# Patient Record
Sex: Female | Born: 1985 | Race: White | Hispanic: No | Marital: Married | Smoking: Never smoker
Health system: Southern US, Community
[De-identification: ages and names within clinical notes are randomized; demographics above are authoritative.]

## PROBLEM LIST (undated history)

## (undated) ENCOUNTER — Inpatient Hospital Stay (HOSPITAL_COMMUNITY): Payer: Self-pay

## (undated) DIAGNOSIS — Z9889 Other specified postprocedural states: Secondary | ICD-10-CM

## (undated) DIAGNOSIS — N83209 Unspecified ovarian cyst, unspecified side: Secondary | ICD-10-CM

## (undated) DIAGNOSIS — J189 Pneumonia, unspecified organism: Secondary | ICD-10-CM

## (undated) DIAGNOSIS — O034 Incomplete spontaneous abortion without complication: Secondary | ICD-10-CM

## (undated) DIAGNOSIS — I10 Essential (primary) hypertension: Secondary | ICD-10-CM

## (undated) DIAGNOSIS — R87619 Unspecified abnormal cytological findings in specimens from cervix uteri: Secondary | ICD-10-CM

## (undated) DIAGNOSIS — J111 Influenza due to unidentified influenza virus with other respiratory manifestations: Secondary | ICD-10-CM

## (undated) DIAGNOSIS — IMO0002 Reserved for concepts with insufficient information to code with codable children: Secondary | ICD-10-CM

## (undated) DIAGNOSIS — F419 Anxiety disorder, unspecified: Secondary | ICD-10-CM

## (undated) HISTORY — PX: WISDOM TOOTH EXTRACTION: SHX21

## (undated) HISTORY — DX: Unspecified ovarian cyst, unspecified side: N83.209

## (undated) HISTORY — PX: COLPOSCOPY: SHX161

---
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
## 2005-05-15 ENCOUNTER — Ambulatory Visit: Payer: Self-pay | Admitting: Internal Medicine

## 2012-01-26 ENCOUNTER — Other Ambulatory Visit: Payer: Self-pay | Admitting: Otolaryngology

## 2012-01-26 ENCOUNTER — Ambulatory Visit
Admission: RE | Admit: 2012-01-26 | Discharge: 2012-01-26 | Disposition: A | Discharge status: Home / Self Care | Payer: BC Managed Care – PPO | Source: Ambulatory Visit | Attending: Otolaryngology | Admitting: Otolaryngology

## 2012-03-23 LAB — OB RESULTS CONSOLE RPR: RPR: NONREACTIVE

## 2012-03-23 LAB — OB RESULTS CONSOLE ABO/RH: RH Type: POSITIVE

## 2012-08-16 ENCOUNTER — Other Ambulatory Visit (HOSPITAL_COMMUNITY): Payer: Self-pay | Admitting: Obstetrics and Gynecology

## 2012-08-16 ENCOUNTER — Other Ambulatory Visit: Payer: Self-pay

## 2012-08-16 DIAGNOSIS — Z3689 Encounter for other specified antenatal screening: Secondary | ICD-10-CM

## 2012-08-16 DIAGNOSIS — IMO0002 Reserved for concepts with insufficient information to code with codable children: Secondary | ICD-10-CM

## 2012-08-16 DIAGNOSIS — O283 Abnormal ultrasonic finding on antenatal screening of mother: Secondary | ICD-10-CM

## 2012-08-17 ENCOUNTER — Inpatient Hospital Stay (HOSPITAL_COMMUNITY)
Admission: AD | Admit: 2012-08-17 | Discharge: 2012-08-19 | DRG: 886 | Disposition: A | Discharge status: Home / Self Care | Payer: BC Managed Care – PPO | Source: Ambulatory Visit | Attending: Obstetrics and Gynecology | Admitting: Obstetrics and Gynecology

## 2012-08-17 ENCOUNTER — Encounter (HOSPITAL_COMMUNITY): Payer: Self-pay | Admitting: *Deleted

## 2012-08-17 ENCOUNTER — Inpatient Hospital Stay (HOSPITAL_COMMUNITY): Payer: BC Managed Care – PPO

## 2012-08-17 DIAGNOSIS — J111 Influenza due to unidentified influenza virus with other respiratory manifestations: Secondary | ICD-10-CM

## 2012-08-17 DIAGNOSIS — J069 Acute upper respiratory infection, unspecified: Secondary | ICD-10-CM | POA: Diagnosis present

## 2012-08-17 DIAGNOSIS — IMO0002 Reserved for concepts with insufficient information to code with codable children: Secondary | ICD-10-CM

## 2012-08-17 DIAGNOSIS — J1100 Influenza due to unidentified influenza virus with unspecified type of pneumonia: Secondary | ICD-10-CM | POA: Diagnosis present

## 2012-08-17 DIAGNOSIS — J189 Pneumonia, unspecified organism: Secondary | ICD-10-CM

## 2012-08-17 DIAGNOSIS — O358XX0 Maternal care for other (suspected) fetal abnormality and damage, not applicable or unspecified: Secondary | ICD-10-CM | POA: Diagnosis present

## 2012-08-17 DIAGNOSIS — O99891 Other specified diseases and conditions complicating pregnancy: Principal | ICD-10-CM | POA: Diagnosis present

## 2012-08-17 DIAGNOSIS — R05 Cough: Secondary | ICD-10-CM | POA: Diagnosis present

## 2012-08-17 DIAGNOSIS — R030 Elevated blood-pressure reading, without diagnosis of hypertension: Secondary | ICD-10-CM | POA: Diagnosis present

## 2012-08-17 DIAGNOSIS — R059 Cough, unspecified: Secondary | ICD-10-CM | POA: Diagnosis present

## 2012-08-17 HISTORY — DX: Influenza due to unidentified influenza virus with other respiratory manifestations: J11.1

## 2012-08-17 LAB — URINALYSIS, ROUTINE W REFLEX MICROSCOPIC
Bilirubin Urine: NEGATIVE
Glucose, UA: NEGATIVE mg/dL
Hgb urine dipstick: NEGATIVE
Ketones, ur: 15 mg/dL — AB
Leukocytes, UA: NEGATIVE
Nitrite: NEGATIVE
Protein, ur: NEGATIVE mg/dL
Specific Gravity, Urine: >1.03 — ABNORMAL HIGH (ref 1.005–1.030)
Urobilinogen, UA: 0.2 mg/dL (ref 0.0–1.0)
pH: 6 (ref 5.0–8.0)

## 2012-08-17 LAB — CBC WITH DIFFERENTIAL/PLATELET
Basophils Absolute: 0 10*3/uL (ref 0.0–0.1)
Basophils Relative: 0 % (ref 0–1)
Eosinophils Absolute: 0.2 10*3/uL (ref 0.0–0.7)
Eosinophils Relative: 1 % (ref 0–5)
HCT: 32.7 % — ABNORMAL LOW (ref 36.0–46.0)
Hemoglobin: 10.8 g/dL — ABNORMAL LOW (ref 12.0–15.0)
Lymphocytes Relative: 10 % — ABNORMAL LOW (ref 12–46)
Lymphs Abs: 1.7 10*3/uL (ref 0.7–4.0)
MCH: 27.6 pg (ref 26.0–34.0)
MCHC: 33 g/dL (ref 30.0–36.0)
MCV: 83.4 fL (ref 78.0–100.0)
Monocytes Absolute: 0.9 10*3/uL (ref 0.1–1.0)
Monocytes Relative: 5 % (ref 3–12)
Neutro Abs: 13.9 10*3/uL — ABNORMAL HIGH (ref 1.7–7.7)
Neutrophils Relative %: 83 % — ABNORMAL HIGH (ref 43–77)
Platelets: 362 10*3/uL (ref 150–400)
RBC: 3.92 MIL/uL (ref 3.87–5.11)
RDW: 12.9 % (ref 11.5–15.5)
WBC: 16.7 10*3/uL — ABNORMAL HIGH (ref 4.0–10.5)

## 2012-08-17 LAB — COMPREHENSIVE METABOLIC PANEL
ALT: 32 U/L (ref 0–35)
AST: 30 U/L (ref 0–37)
Alkaline Phosphatase: 158 U/L — ABNORMAL HIGH (ref 39–117)
CO2: 22 mEq/L (ref 19–32)
Chloride: 100 mEq/L (ref 96–112)
GFR calc non Af Amer: >90 mL/min (ref >90)
Glucose, Bld: 86 mg/dL (ref 70–99)
Sodium: 135 mEq/L (ref 135–145)
Total Bilirubin: 0.2 mg/dL — ABNORMAL LOW (ref 0.3–1.2)

## 2012-08-17 LAB — TYPE AND SCREEN: ABO/RH(D): O POS

## 2012-08-17 MED ORDER — DEXTROSE 5 % IV SOLN
1.0000 g | Freq: Two times a day (BID) | INTRAVENOUS | Status: DC
  Administered 2012-08-18 – 2012-08-19 (×3): 1 g via INTRAVENOUS
  Filled 2012-08-17 (×3): qty 10

## 2012-08-17 MED ORDER — CALCIUM CARBONATE ANTACID 500 MG PO CHEW: 2.0000 | CHEWABLE_TABLET | ORAL | Status: DC | PRN

## 2012-08-17 MED ORDER — ACETAMINOPHEN 500 MG PO TABS
1000.0000 mg | ORAL_TABLET | Freq: Once | ORAL | Status: AC
  Administered 2012-08-17: 1000 mg via ORAL
  Filled 2012-08-17: qty 2

## 2012-08-17 MED ORDER — SODIUM CHLORIDE 0.9 % IV SOLN
INTRAVENOUS | Status: DC
  Administered 2012-08-17 – 2012-08-18 (×2): via INTRAVENOUS

## 2012-08-17 MED ORDER — LACTATED RINGERS IV SOLN
INTRAVENOUS | Status: DC
  Administered 2012-08-17: 16:00:00 via INTRAVENOUS

## 2012-08-17 MED ORDER — PRENATAL MULTIVITAMIN CH: 1.0000 | ORAL_TABLET | Freq: Every day | ORAL | Status: DC

## 2012-08-17 MED ORDER — CEFTRIAXONE SODIUM 1 G IJ SOLR: 1.0000 g | INTRAMUSCULAR | Status: DC

## 2012-08-17 MED ORDER — DOCUSATE SODIUM 100 MG PO CAPS
100.0000 mg | ORAL_CAPSULE | Freq: Every day | ORAL | Status: DC
  Filled 2012-08-17: qty 1

## 2012-08-17 MED ORDER — DEXTROSE 5 % IV SOLN
1.0000 g | INTRAVENOUS | Status: DC
  Administered 2012-08-17: 1 g via INTRAVENOUS
  Filled 2012-08-17: qty 10

## 2012-08-17 MED ORDER — AZITHROMYCIN 500 MG PO TABS
500.0000 mg | ORAL_TABLET | Freq: Every day | ORAL | Status: DC
  Administered 2012-08-17 – 2012-08-18 (×2): 500 mg via ORAL
  Filled 2012-08-17 (×3): qty 1

## 2012-08-17 MED ORDER — HYDROCOD POLST-CHLORPHEN POLST 10-8 MG/5ML PO LQCR
5.0000 mL | Freq: Two times a day (BID) | ORAL | Status: DC | PRN
  Administered 2012-08-17 – 2012-08-19 (×3): 5 mL via ORAL
  Filled 2012-08-17 (×3): qty 5

## 2012-08-17 MED ORDER — ZOLPIDEM TARTRATE 5 MG PO TABS: 5.0000 mg | ORAL_TABLET | Freq: Every evening | ORAL | Status: DC | PRN

## 2012-08-17 MED ORDER — ACETAMINOPHEN 325 MG PO TABS: 650.0000 mg | ORAL_TABLET | ORAL | Status: DC | PRN

## 2012-08-17 NOTE — MAU Note (Signed)
A wk ago had a fever, saw dr was dx with sinus infection, on Fri still had a fever- was cultured and tested pos for flu.  Too late to start on tamiflu. Yesterday was reg appt, was feeling better in a.m.  In the afternoon, started feeling worse, cough is worse and has become short of breath.

## 2012-08-17 NOTE — H&P (Signed)
Danae Orleans, CNM Certified Nurse Midwife Signed Obstetrics MAU Provider Note 08/17/2012 1:41 PM  Chief Complaint:  Shortness of Breath, Cough and Influenza Initial contact at 1335     HPI: Jasmine Wolfe is a 26 y.o. G1P0 at 73w4dwho presents to maternity admissions reporting Insidious onset over night of shortness of breath and worsening of her cough. She first reported fever and was seen about a week ago in the office and given amoxicillin for sinusitis. She had a fever again 3 days ago and was seen in Urgent Care and had a positive flu swab. Last took Tylenol at 0400. Her cough is associated with bilateral chest and rib soreness. Cough is loose but nonproductive. Shortness of breath is not positional. Denies dysuria, urgency or frequency of urination. Denies contractions, leakage of fluid or vaginal bleeding. Good fetal movement.   No     Pregnancy Course: no other pregnancy problem   Past Medical History: Past Medical History   Diagnosis  Date   .  Influenza      Denies hx asthma or HTN.    Past obstetric history: OB History      Grav  Para  Term  Preterm  Abortions  TAB  SAB  Ect  Mult  Living     1                          #  Outc  Date  GA  Lbr Len/2nd  Wgt  Sex  Del  Anes  PTL  Lv     1  CUR                           Past Surgical History: Past Surgical History   Procedure  Date   .  Wisdom tooth extraction        Family History: History reviewed. No pertinent family history.   Social History: History   Substance Use Topics   .  Smoking status:  Never Smoker    .  Smokeless tobacco:  Not on file   .  Alcohol Use:  No      Allergies: No Known Allergies   Meds:  Prescriptions prior to admission   Medication  Sig  Dispense  Refill   .  acetaminophen (TYLENOL) 500 MG tablet  Take 1,000 mg by mouth every 6 (six) hours as needed. For pain/headache/fever         .  Prenatal Vit-Fe Fumarate-FA (PRENATAL MULTIVITAMIN) TABS  Take 1 tablet by mouth at  bedtime.            ROS: Pertinent findings in history of present illness.    Physical Exam    Blood pressure 147/89, pulse 140, temperature 98.2 F (36.8 C), temperature source Oral, resp. rate 24, SpO2 94.00%. GENERAL: Well-developed, well-nourished female in moderate distress with frequent loose coughing HEENT: normocephalic HEART: Tachycardic, no arrythmia noted, no murmur RESP: normal effort, transmitted coarse rhonchi with cough, decreased BS RRL and RML, ? Rales RML ABDOMEN: Soft, non-tender, gravid appropriate for gestational age EXTREMITIES: Nontender, no edema NEURO: alert and oriented     FHT:  Baseline 155 , moderate variability, accelerations present, no decelerations Contractions: rare    Labs: Results for orders placed during the hospital encounter of 08/17/12 (from the past 24 hour(s))   URINALYSIS, ROUTINE W REFLEX MICROSCOPIC     Status: Abnormal     Collection Time  08/17/12  1:30 PM       Component  Value  Range     Color, Urine  YELLOW   YELLOW     APPearance  HAZY (*)  CLEAR     Specific Gravity, Urine  >1.030 (*)  1.005 - 1.030     pH  6.0   5.0 - 8.0     Glucose, UA  NEGATIVE   NEGATIVE mg/dL     Hgb urine dipstick  NEGATIVE   NEGATIVE     Bilirubin Urine  NEGATIVE   NEGATIVE     Ketones, ur  15 (*)  NEGATIVE mg/dL     Protein, ur  NEGATIVE   NEGATIVE mg/dL     Urobilinogen, UA  0.2   0.0 - 1.0 mg/dL     Nitrite  NEGATIVE   NEGATIVE     Leukocytes, UA  NEGATIVE   NEGATIVE   CBC WITH DIFFERENTIAL     Status: Abnormal     Collection Time     08/17/12  1:41 PM       Component  Value  Range     WBC  16.7 (*)  4.0 - 10.5 K/uL     RBC  3.92   3.87 - 5.11 MIL/uL     Hemoglobin  10.8 (*)  12.0 - 15.0 g/dL     HCT  78.2 (*)  95.6 - 46.0 %     MCV  83.4   78.0 - 100.0 fL     MCH  27.6   26.0 - 34.0 pg     MCHC  33.0   30.0 - 36.0 g/dL     RDW  21.3   08.6 - 15.5 %     Platelets  362   150 - 400 K/uL     Neutrophils Relative  83 (*)  43 - 77 %       Neutro Abs  13.9 (*)  1.7 - 7.7 K/uL     Lymphocytes Relative  10 (*)  12 - 46 %     Lymphs Abs  1.7   0.7 - 4.0 K/uL     Monocytes Relative  5   3 - 12 %     Monocytes Absolute  0.9   0.1 - 1.0 K/uL     Eosinophils Relative  1   0 - 5 %     Eosinophils Absolute  0.2   0.0 - 0.7 K/uL     Basophils Relative  0   0 - 1 %     Basophils Absolute  0.0   0.0 - 0.1 K/uL   COMPREHENSIVE METABOLIC PANEL     Status: Abnormal     Collection Time     08/17/12  1:53 PM       Component  Value  Range     Sodium  135   135 - 145 mEq/L     Potassium  4.0   3.5 - 5.1 mEq/L     Chloride  100   96 - 112 mEq/L     CO2  22   19 - 32 mEq/L     Glucose, Bld  86   70 - 99 mg/dL     BUN  7   6 - 23 mg/dL     Creatinine, Ser  5.78 (*)  0.50 - 1.10 mg/dL     Calcium  9.0   8.4 - 10.5 mg/dL     Total Protein  7.0   6.0 - 8.3 g/dL     Albumin  2.4 (*)  3.5 - 5.2 g/dL     AST  30   0 - 37 U/L     ALT  32   0 - 35 U/L     Alkaline Phosphatase  158 (*)  39 - 117 U/L     Total Bilirubin  0.2 (*)  0.3 - 1.2 mg/dL     GFR calc non Af Amer  >90   >90 mL/min     GFR calc Af Amer  >90   >90 mL/min      Imaging:   Clinical Data: Shortness of breath, cough, fever   CHEST - 2 VIEW   Comparison: None.   Findings: There is vague opacity at the right lung base suspicious   for pneumonia possibly involving the right lower lobe and/or right   middle lobe. The left lung is clear. No effusion is seen.   Mediastinal contours appear normal. The heart is normal in size.   IMPRESSION:   Right basilar parenchymal opacity consistent with pneumonia   possibly involving right middle lobe and/or right lower lobe.   Original Report Authenticated By: Juline Patch, M.D.     MAU Course: Nasal O2 at 2-4 l /min to keep sat O2 >95%     Assessment: 1.  Pneumonia     G1 at [redacted]w[redacted]d Category 1 FHR BP elevation   Plan: C/W Dr. Jackelyn Knife re admisssion  As per above, it appears that she has pneumonia.  Will admit for O2  and antibiotics, too late for Tamiflu.  Started Zithromax and Rocephin at recommendation of Hospitalist who has been consulted and should be here this pm for any further recommendations.

## 2012-08-17 NOTE — Consult Note (Signed)
[facility] Medical Consultation  IRANIA DURELL NWG:[id number] DOB: February 16, 1986 DOA: 08/17/2012 PCP: No primary provider on file.   Requesting physician: Lavina Hamman, MD Date of consultation: 08/17/2012 Reason for consultation: pneumonia  Impression/Recommendations 1. CAP--appears stable. Recommend Rocephin, Zithromax. 2. Influenza--diagnosed approximately 5 days ago with symptoms preceding for several days. Supportive care. 3. IUP--per OB.  My partner will followup again tomorrow. Thank you for this consultation. I discussed with Dr. Jackelyn Knife earlier today.  Brendia Sacks, MD [facility] Team 6 Pager [phone]  If 8PM-8AM, please contact night-coverage [email or web address] Password [email or web address]  Chief Complaint: cough, fever  HPI:  26 year old Wolfe G1P0 at [redacted]w[redacted]d presented to MAU today with fever, cough and shortness of breath. CXR revealed pneumonia, patient was admitted and hospitalist consultation requested for assistance with treatment of pneumonia.  Symptoms began approximately 10 days ago with fever, cough. She was seen ~10/15, diagnosed with sinusitis and started on amoxicillin.  Symptoms persisted and patient was seen at urgent care and diagnosed with influenza but not offered Tamiflu because of chronicity of symptoms. Her fever, cough and shortness of breath continued to worsen and so she came to [facility] today. She has no previous history of pneumonia or history of past medical problems.  In MAU was noted to be tachycardic, tachypneic and moderately ill. WBC 16.7, CMP unremarkable, urinalysis negative.  Review of Systems:  Negative for visual changes, sore throat, rash, new muscle aches, chest pain (except with cough), dysuria, bleeding, n/v/abdominal pain/diarrhea.  Past Medical History  Diagnosis Date  . Influenza   . No pertinent past medical history    Past Surgical History  Procedure Date  . Wisdom tooth extraction    Social History:  reports that  she has never smoked. She does not have any smokeless tobacco history on file. She reports that she does not drink alcohol or use illicit drugs.  No Known Allergies  History reviewed. No pertinent family history. Denies particular family medical problems.  Prior to Admission medications   Medication Sig Start Date End Date Taking? Authorizing Provider  acetaminophen (TYLENOL) 500 MG tablet Take 1,000 mg by mouth every 6 (six) hours as needed. For pain/headache/fever   Yes Historical Provider, MD  Prenatal Vit-Fe Fumarate-FA (PRENATAL MULTIVITAMIN) TABS Take 1 tablet by mouth at bedtime.   Yes Historical Provider, MD   Physical Exam: Filed Vitals:   08/17/12 1750 08/17/12 1755 08/17/12 1800 08/17/12 1805  BP:      Pulse: 127 125 122 116  Temp:      TempSrc:      Resp:      Height:      Weight:      SpO2: 97% 98% 97% 98%    General:  Appears calm, mildly uncomfortable; non-toxic Eyes: PERRL, normal lids, irises. Wears glasses ENT: grossly normal hearing, lips & tongue Neck: no LAD, masses or thyromegaly Cardiovascular: tachycardic, no m/r/g. No LE edema. Respiratory: Left lung fields CTA, no w/r/r. Right posterior lung fields with diminished breath sounds, rhonchi. No wheezes or rales noted. Mild to moderate increased respiratory effort. Speaks in full sentences. Abdomen: gravid, not examined Skin: no rash or induration seen on limited exam Musculoskeletal: grossly normal tone BUE/BLE Psychiatric: grossly normal mood and affect, speech fluent and appropriate  Labs on Admission:  Basic Metabolic Panel:  Lab 08/17/12 2952  NA 135  K 4.0  CL 100  CO2 22  GLUCOSE 86  BUN 7  CREATININE 0.36*  CALCIUM 9.0  MG --  PHOS --  Liver Function Tests:  Lab 08/17/12 1353  AST 30  ALT 32  ALKPHOS 158*  BILITOT 0.2*  PROT 7.0  ALBUMIN 2.4*   CBC:  Lab 08/17/12 1341  WBC 16.7*  NEUTROABS 13.9*  HGB 10.8*  HCT 32.7*  MCV 83.4  PLT 362   Radiological Exams on  Admission: Dg Chest 2 View  08/17/2012  *RADIOLOGY REPORT*  Clinical Data: Shortness of breath, cough, fever  CHEST - 2 VIEW  Comparison: None.  Findings: There is vague opacity at the right lung base suspicious for pneumonia possibly involving the right lower lobe and/or right middle lobe.  The left lung is clear.  No effusion is seen. Mediastinal contours appear normal.  The heart is normal in size.  IMPRESSION: Right basilar parenchymal opacity consistent with pneumonia possibly involving right middle lobe and/or right lower lobe.   Original Report Authenticated By: Juline Patch, M.D.     Principal Problem:  *CAP (community acquired pneumonia) Active Problems:  Upper respiratory infection, acute  Influenza   Time spent: 55 minutes  Brendia Sacks, MD [facility] Team 6 Pager [phone]  If 8PM-8AM, please contact night-coverage [email or web address] Password [facility] 08/17/2012, 6:21 PM

## 2012-08-17 NOTE — MAU Provider Note (Signed)
Chief Complaint:  Shortness of Breath, Cough and Influenza  Initial contact at 1335    HPI: Jasmine Wolfe is a 26 y.o. G1P0 at 12w4dwho presents to maternity admissions reporting Insidious onset over night of shortness of breath and worsening of her cough. She first reported fever and was seen about a week ago in the office and given amoxicillin for sinusitis. She had a fever again 3 days ago and was seen in Urgent Care and had a positive flu swab. Last took Tylenol at 0400. Her cough is associated with bilateral chest and rib soreness. Cough is loose but nonproductive. Shortness of breath is not positional. Denies dysuria, urgency or frequency of urination. Denies contractions, leakage of fluid or vaginal bleeding. Good fetal movement.  No   Pregnancy Course: no other pregnancy problem  Past Medical History: Past Medical History  Diagnosis Date  . Influenza    Denies hx asthma or HTN.   Past obstetric history: OB History    Grav Para Term Preterm Abortions TAB SAB Ect Mult Living   1              # Outc Date GA Lbr Len/2nd Wgt Sex Del Anes PTL Lv   1 CUR               Past Surgical History: Past Surgical History  Procedure Date  . Wisdom tooth extraction     Family History: History reviewed. No pertinent family history.  Social History: History  Substance Use Topics  . Smoking status: Never Smoker   . Smokeless tobacco: Not on file  . Alcohol Use: No    Allergies: No Known Allergies  Meds:  Prescriptions prior to admission  Medication Sig Dispense Refill  . acetaminophen (TYLENOL) 500 MG tablet Take 1,000 mg by mouth every 6 (six) hours as needed. For pain/headache/fever      . Prenatal Vit-Fe Fumarate-FA (PRENATAL MULTIVITAMIN) TABS Take 1 tablet by mouth at bedtime.        ROS: Pertinent findings in history of present illness.  Physical Exam  Blood pressure 147/89, pulse 140, temperature 98.2 F (36.8 C), temperature source Oral, resp. rate 24, SpO2  94.00%. GENERAL: Well-developed, well-nourished female in moderate distress with frequent loose coughing HEENT: normocephalic HEART: Tachycardic, no arrythmia noted, no murmur RESP: normal effort, transmitted coarse rhonchi with cough, decreased BS RRL and RML, ? Rales RML ABDOMEN: Soft, non-tender, gravid appropriate for gestational age EXTREMITIES: Nontender, no edema NEURO: alert and oriented   FHT:  Baseline 155 , moderate variability, accelerations present, no decelerations Contractions: rare   Labs: Results for orders placed during the hospital encounter of 08/17/12 (from the past 24 hour(s))  URINALYSIS, ROUTINE W REFLEX MICROSCOPIC     Status: Abnormal   Collection Time   08/17/12  1:30 PM      Component Value Range   Color, Urine YELLOW  YELLOW   APPearance HAZY (*) CLEAR   Specific Gravity, Urine >1.030 (*) 1.005 - 1.030   pH 6.0  5.0 - 8.0   Glucose, UA NEGATIVE  NEGATIVE mg/dL   Hgb urine dipstick NEGATIVE  NEGATIVE   Bilirubin Urine NEGATIVE  NEGATIVE   Ketones, ur 15 (*) NEGATIVE mg/dL   Protein, ur NEGATIVE  NEGATIVE mg/dL   Urobilinogen, UA 0.2  0.0 - 1.0 mg/dL   Nitrite NEGATIVE  NEGATIVE   Leukocytes, UA NEGATIVE  NEGATIVE  CBC WITH DIFFERENTIAL     Status: Abnormal   Collection Time   08/17/12  1:41 PM      Component Value Range   WBC 16.7 (*) 4.0 - 10.5 K/uL   RBC 3.92  3.87 - 5.11 MIL/uL   Hemoglobin 10.8 (*) 12.0 - 15.0 g/dL   HCT 16.1 (*) 09.6 - 04.5 %   MCV 83.4  78.0 - 100.0 fL   MCH 27.6  26.0 - 34.0 pg   MCHC 33.0  30.0 - 36.0 g/dL   RDW 40.9  81.1 - 91.4 %   Platelets 362  150 - 400 K/uL   Neutrophils Relative 83 (*) 43 - 77 %   Neutro Abs 13.9 (*) 1.7 - 7.7 K/uL   Lymphocytes Relative 10 (*) 12 - 46 %   Lymphs Abs 1.7  0.7 - 4.0 K/uL   Monocytes Relative 5  3 - 12 %   Monocytes Absolute 0.9  0.1 - 1.0 K/uL   Eosinophils Relative 1  0 - 5 %   Eosinophils Absolute 0.2  0.0 - 0.7 K/uL   Basophils Relative 0  0 - 1 %   Basophils Absolute  0.0  0.0 - 0.1 K/uL  COMPREHENSIVE METABOLIC PANEL     Status: Abnormal   Collection Time   08/17/12  1:53 PM      Component Value Range   Sodium 135  135 - 145 mEq/L   Potassium 4.0  3.5 - 5.1 mEq/L   Chloride 100  96 - 112 mEq/L   CO2 22  19 - 32 mEq/L   Glucose, Bld 86  70 - 99 mg/dL   BUN 7  6 - 23 mg/dL   Creatinine, Ser 7.82 (*) 0.50 - 1.10 mg/dL   Calcium 9.0  8.4 - 95.6 mg/dL   Total Protein 7.0  6.0 - 8.3 g/dL   Albumin 2.4 (*) 3.5 - 5.2 g/dL   AST 30  0 - 37 U/L   ALT 32  0 - 35 U/L   Alkaline Phosphatase 158 (*) 39 - 117 U/L   Total Bilirubin 0.2 (*) 0.3 - 1.2 mg/dL   GFR calc non Af Amer >90  >90 mL/min   GFR calc Af Amer >90  >90 mL/min    Imaging:  Clinical Data: Shortness of breath, cough, fever  CHEST - 2 VIEW  Comparison: None.  Findings: There is vague opacity at the right lung base suspicious  for pneumonia possibly involving the right lower lobe and/or right  middle lobe. The left lung is clear. No effusion is seen.  Mediastinal contours appear normal. The heart is normal in size.  IMPRESSION:  Right basilar parenchymal opacity consistent with pneumonia  possibly involving right middle lobe and/or right lower lobe.  Original Report Authenticated By: Juline Patch, M.D.   MAU Course: Nasal O2 at 2-4 l /min to keep sat O2 >95%   Assessment: 1. Pneumonia   G1 at [redacted]w[redacted]d Category 1 FHR BP elevation  Plan: C/W Dr. Jackelyn Knife re admisssion    Medication List     As of 08/17/2012  1:42 PM    ASK your doctor about these medications         acetaminophen 500 MG tablet   Commonly known as: TYLENOL   Take 1,000 mg by mouth every 6 (six) hours as needed. For pain/headache/fever      prenatal multivitamin Tabs   Take 1 tablet by mouth at bedtime.        Danae Orleans, CNM 08/17/2012 1:42 PM

## 2012-08-18 ENCOUNTER — Ambulatory Visit (HOSPITAL_COMMUNITY)
Admission: RE | Admit: 2012-08-18 | Discharge: 2012-08-18 | Disposition: A | Discharge status: Home / Self Care | Payer: BC Managed Care – PPO | Source: Ambulatory Visit | Attending: Obstetrics and Gynecology | Admitting: Obstetrics and Gynecology

## 2012-08-18 ENCOUNTER — Inpatient Hospital Stay (HOSPITAL_COMMUNITY): Payer: BC Managed Care – PPO

## 2012-08-18 DIAGNOSIS — Z3689 Encounter for other specified antenatal screening: Secondary | ICD-10-CM

## 2012-08-18 DIAGNOSIS — O283 Abnormal ultrasonic finding on antenatal screening of mother: Secondary | ICD-10-CM

## 2012-08-18 DIAGNOSIS — IMO0002 Reserved for concepts with insufficient information to code with codable children: Secondary | ICD-10-CM

## 2012-08-18 LAB — CBC
MCH: 27.7 pg (ref 26.0–34.0)
MCHC: 33.1 g/dL (ref 30.0–36.0)
Platelets: 310 10*3/uL (ref 150–400)
RDW: 13 % (ref 11.5–15.5)

## 2012-08-18 MED ORDER — HYDROCODONE-ACETAMINOPHEN 5-325 MG PO TABS
1.0000 | ORAL_TABLET | Freq: Four times a day (QID) | ORAL | Status: DC | PRN
  Administered 2012-08-18: 2 via ORAL
  Filled 2012-08-18: qty 2

## 2012-08-18 MED ORDER — DEXTROMETHORPHAN POLISTIREX 30 MG/5ML PO LQCR
30.0000 mg | Freq: Two times a day (BID) | ORAL | Status: DC
  Administered 2012-08-18 (×2): 30 mg via ORAL
  Filled 2012-08-18 (×3): qty 5

## 2012-08-18 NOTE — Progress Notes (Signed)
Pt back from mfm.

## 2012-08-18 NOTE — Progress Notes (Signed)
[facility] PROGRESS NOTE  Jasmine Wolfe YNW:[id number] DOB: Oct 15, 1986 DOA: 08/17/2012 PCP: No primary provider on file.  Assessment/Plan: 1. Community acquired pneumonia clinically improving. Cont rocephin and azithro ( day 2). monitor wbc in am. Slowly improving.    2. Influenza--diagnosed approximately 5 days ago with symptoms preceding for several days. Supportive care.   3. IUP--managed per OB.    Procedures:  none  Antibiotics:  IV ceftriaxone and azithro ( day 2)  HPI/Subjective: Feels her SOB to be better. No fever. Has cough with whitish phlegm  Objective: Filed Vitals:   08/18/12 1151 08/18/12 1230 08/18/12 1500 08/18/12 1610  BP:    149/79  Pulse: 118 107  103  Temp:    98.4 F (36.9 C)  TempSrc:    Oral  Resp:    20  Height:      Weight:      SpO2: 93% 95% 100% 100%   No intake or output data in the 24 hours ending 08/18/12 1649 Filed Weights   08/17/12 1615  Weight: 112.946 kg (249 lb)    Exam:   General:  NAD  HEENT: no pallor, moist mucosa  Cardiovascular: NS1&S2, no murmrus  Respiratory: clear breath sounds b/l, no added sounds  Abdomen: gravid female , BS+      EXT: warm, no edema  CNS: AAOX 3  Data Reviewed: Basic Metabolic Panel:  Lab 08/17/12 6578  NA 135  K 4.0  CL 100  CO2 22  GLUCOSE 86  BUN 7  CREATININE 0.36*  CALCIUM 9.0  MG --  PHOS --   Liver Function Tests:  Lab 08/17/12 1353  AST 30  ALT 32  ALKPHOS 158*  BILITOT 0.2*  PROT 7.0  ALBUMIN 2.4*   No results found for this basename: LIPASE:5,AMYLASE:5 in the last 168 hours No results found for this basename: AMMONIA:5 in the last 168 hours CBC:  Lab 08/18/12 0450 08/17/12 1341  WBC 15.7* 16.7*  NEUTROABS -- 13.9*  HGB 10.1* 10.8*  HCT 30.5* 32.7*  MCV 83.8 83.4  PLT 310 362   Cardiac Enzymes: No results found for this basename: CKTOTAL:5,CKMB:5,CKMBINDEX:5,TROPONINI:5 in the last 168 hours BNP (last 3 results) No results found for  this basename: PROBNP:3 in the last 8760 hours CBG: No results found for this basename: GLUCAP:5 in the last 168 hours  No results found for this or any previous visit (from the past 240 hour(s)).   Studies: Dg Chest 2 View  08/17/2012  *RADIOLOGY REPORT*  Clinical Data: Shortness of breath, cough, fever  CHEST - 2 VIEW  Comparison: None.  Findings: There is vague opacity at the right lung base suspicious for pneumonia possibly involving the right lower lobe and/or right middle lobe.  The left lung is clear.  No effusion is seen. Mediastinal contours appear normal.  The heart is normal in size.  IMPRESSION: Right basilar parenchymal opacity consistent with pneumonia possibly involving right middle lobe and/or right lower lobe.   Original Report Authenticated By: Juline Patch, M.D.    US Ob Detail + 14 Wk  08/18/2012  OBSTETRICAL ULTRASOUND: This exam was performed within a [place] [facility]. The OB US report was generated in the [facility] system, and faxed to the ordering physician.   This report is also available in [employer] and in the [employer]. See [facility] Obstetric US report.    Scheduled Meds:   . azithromycin  500 mg Oral Daily  . cefTRIAXone (ROCEPHIN)  IV  1 g Intravenous Q12H  . dextromethorphan  30 mg Oral BID  . docusate sodium  100 mg Oral Daily  . prenatal multivitamin  1 tablet Oral Daily  . DISCONTD: cefTRIAXone (ROCEPHIN)  IV  1 g Intravenous Q24H   Continuous Infusions:   . sodium chloride 50 mL/hr at 08/18/12 1019      Time spent: 25 MINUTES    Jasmine Wolfe  [facility] Pager [phone]. If 8PM-8AM, please contact night-coverage at [email or web address], password [facility] 08/18/2012, 4:49 PM  LOS: 1 day

## 2012-08-18 NOTE — Progress Notes (Signed)
Pt to mfm. 

## 2012-08-18 NOTE — Progress Notes (Signed)
Ur chart review completed.  

## 2012-08-18 NOTE — Progress Notes (Addendum)
Patient ID: MAGDALINA WHITEHEAD, female   DOB: Nov 21, 1985, 26 y.o.   MRN: [id number]  26yo G1P0 at 28+ with CAP, also Flu.  Cough, SOB - feeling better.  O2 sats greater than 95-96% Tussionex bid, will add delsym and norco.  +FM, no LOF, no VB, no ctx  AF VSS gen NAD Lungs dec BS right lower, ow CTA Abd soft, FNT  FHTs 130's  26yo with CAP/flu hospitalized for abx and O2.  Will add 20 min strip daily.  Also check pertussis titer.  Also saw MFM this am, confirmed mild ventriculomegaly.  Had NIPT this am.  Will repeat US in 4 weeks.     Appreciate hospitalist input

## 2012-08-18 NOTE — Progress Notes (Signed)
Pt. States productive cough at times.

## 2012-08-18 NOTE — Progress Notes (Signed)
Jasmine Wolfe  was seen today for an ultrasound appointment.  See full report in [facility].  Alpha Gula, MD  Comments: [name] is seen today due to suspected mild ventriculomegaly on recent ultrasound.  On exam today, the left ventricle measures 10-11 mm (mild ventriculomegaly).  The right lateral ventricle is normal.  The previously noted renal pylectasis has largely resolved - the left renal pelvis measures 5-6 mm (borderline)  The patient previously declined serum screening.  The patient was informed that ventriculomegaly is associated with a 2-4% risk of aneuploidy.  After counseling, she elected to undergo cell free fetal DNA (Harmony test).  Impression: Single IUP at 30 4/7 weeks Mild unilateral ventriculomegaly (left) measuring 10-11 mm; the right lateral ventricle appears normal The remainder of the cranial anatomy appears normal The left renal pelvis measures 5-6 mm (borderline).  The right renal pelvis is normal Fetal growth is appropriate (64th %tile) Normal amniotic fluid volume  Recommendations: Recommend follow up in 4 weeks for interval growth and to reevaluate the lateral ventricles.

## 2012-08-19 LAB — TYPE AND SCREEN
ABO/RH(D): O POS
Antibody Screen: NEGATIVE

## 2012-08-19 LAB — CBC
HCT: 29.8 % — ABNORMAL LOW (ref 36.0–46.0)
Hemoglobin: 9.8 g/dL — ABNORMAL LOW (ref 12.0–15.0)
RDW: 13 % (ref 11.5–15.5)
WBC: 10 10*3/uL (ref 4.0–10.5)

## 2012-08-19 MED ORDER — AMOXICILLIN-POT CLAVULANATE 875-125 MG PO TABS: 1.0000 | ORAL_TABLET | Freq: Two times a day (BID) | ORAL | Status: DC

## 2012-08-19 MED ORDER — HYDROCODONE-ACETAMINOPHEN 5-325 MG PO TABS: 1.0000 | ORAL_TABLET | Freq: Four times a day (QID) | ORAL | Status: DC | PRN

## 2012-08-19 MED ORDER — HYDROCOD POLST-CHLORPHEN POLST 10-8 MG/5ML PO LQCR: 5.0000 mL | Freq: Two times a day (BID) | ORAL | Status: DC | PRN

## 2012-08-19 MED ORDER — DEXTROMETHORPHAN POLISTIREX 30 MG/5ML PO LQCR: 30.0000 mg | Freq: Two times a day (BID) | ORAL | Status: DC

## 2012-08-19 NOTE — Progress Notes (Signed)
Discussed with OB team over the phone. Patient symptomatically feeling better and remains afebrile for over 48 hours. Wbc trending down well and normal today. primary team plan on discharging patient home this am . Agree with plan. Recommended for discharging on po Augmentin 875-125 mg bid for 5 days to complete a 7 day course of antibiotics along with antitussives.   Medical consult will sign off.

## 2012-08-19 NOTE — Discharge Summary (Signed)
Obstetric Discharge Summary Reason for Admission: Community Acquired Pneumonia, Influenza, Fetal Monitoring Prenatal Procedures: NST and ultrasound, CXR, antibiotics, O2 Intrapartum Procedures: N/A Postpartum Procedures: N/A Complications-Operative and Postpartum: N/A Hemoglobin  Date Value Range Status  08/19/2012 9.8* 12.0 - 15.0 g/dL Final     HCT  Date Value Range Status  08/19/2012 29.8* 36.0 - 46.0 % Final    Physical Exam:  General: alert and no distress Lungs R base with coarse breath sounds, o/w clear Uterine Fundus: soft, NT  Discharge Diagnoses: CAP, Flu, normal pregnancy  Discharge Information: Date: 08/19/2012 Activity: unrestricted Diet: routine Medications: PNV, Vicodin and Augmentin, Tussionex, Hydrocodone, Delsym Condition: improved Instructions: refer to practice specific booklet Discharge to: home Follow-up Information    Follow up with MEISINGER,TODD D, MD. (09/01/12 at 4pm - as scheduled)    Contact information:   [address] [place] [address] [phone]         BOVARD,Geroge Gilliam 08/19/2012, 9:24 AM

## 2012-08-19 NOTE — OB Triage Note (Signed)
Pt verbalizes understanding of all discharge instructions.

## 2012-08-19 NOTE — Progress Notes (Signed)
Patient ID: Jasmine Wolfe, female   DOB: 1985/11/11, 26 y.o.   MRN: [id number]  26yo G1 @ 28+ with CAP/Flu, feeling better, continued cough.  +FM, no LOF, no VB, no ctx; D/W hospitalist OK for d/c.  Send with antibiotics (Augmentin) for 7 days.  Also has f/u arranged with MFM  AFVSS WBC 10 gen NAD CV RRR Lungs diminished, R base, o/w CTA Abd soft, FNT Ext sym, NT  FHTs 145-150 toco none  Plan for d/c, f/u in office as scheduled

## 2012-08-25 LAB — BORDETELLA PERTUSSIS ANTIBODY
B pertussis IgA Ab, Quant: 1.1
B pertussis IgM Ab, Quant: 1.3 not reported

## 2012-08-30 ENCOUNTER — Telehealth (HOSPITAL_COMMUNITY): Payer: Self-pay | Admitting: MS"

## 2012-08-30 NOTE — Telephone Encounter (Signed)
Called Jasmine Wolfe to discuss her Harmony, cell free fetal DNA testing. Testing was offered because of previous ultrasound finding. We reviewed that these are within normal limits, showing a less than 1 in 10,000 risk for trisomies 21, 18 and 13. We reviewed that this testing identifies > 99% of pregnancies with trisomy 21, >98% of pregnancies with trisomy 25, and >80% with trisomy 79; the false positive rate is <0.1% for all conditions. She understands that this testing does not identify all genetic conditions. All questions were answered to her satisfaction, she was encouraged to call with additional questions or concerns.  Quinn Plowman, MS Certified Genetic Counselor 08/30/2012 11:30 AM

## 2012-09-07 ENCOUNTER — Other Ambulatory Visit: Payer: Self-pay

## 2012-09-16 ENCOUNTER — Encounter (HOSPITAL_COMMUNITY): Payer: Self-pay

## 2012-09-16 ENCOUNTER — Ambulatory Visit (HOSPITAL_COMMUNITY)
Admit: 2012-09-16 | Discharge: 2012-09-16 | Disposition: A | Discharge status: Home / Self Care | Payer: BC Managed Care – PPO | Attending: Obstetrics and Gynecology | Admitting: Obstetrics and Gynecology

## 2012-09-16 DIAGNOSIS — O358XX0 Maternal care for other (suspected) fetal abnormality and damage, not applicable or unspecified: Secondary | ICD-10-CM | POA: Insufficient documentation

## 2012-09-16 DIAGNOSIS — O350XX0 Maternal care for (suspected) central nervous system malformation in fetus, not applicable or unspecified: Secondary | ICD-10-CM | POA: Insufficient documentation

## 2012-09-16 DIAGNOSIS — O34599 Maternal care for other abnormalities of gravid uterus, unspecified trimester: Secondary | ICD-10-CM | POA: Insufficient documentation

## 2012-09-16 DIAGNOSIS — O3500X0 Maternal care for (suspected) central nervous system malformation or damage in fetus, unspecified, not applicable or unspecified: Secondary | ICD-10-CM | POA: Insufficient documentation

## 2012-09-16 DIAGNOSIS — Z1389 Encounter for screening for other disorder: Secondary | ICD-10-CM | POA: Insufficient documentation

## 2012-09-16 DIAGNOSIS — O3440 Maternal care for other abnormalities of cervix, unspecified trimester: Secondary | ICD-10-CM | POA: Insufficient documentation

## 2012-09-16 DIAGNOSIS — Z363 Encounter for antenatal screening for malformations: Secondary | ICD-10-CM | POA: Insufficient documentation

## 2012-09-16 DIAGNOSIS — IMO0002 Reserved for concepts with insufficient information to code with codable children: Secondary | ICD-10-CM

## 2012-09-16 DIAGNOSIS — N83209 Unspecified ovarian cyst, unspecified side: Secondary | ICD-10-CM | POA: Insufficient documentation

## 2012-09-16 NOTE — Addendum Note (Signed)
Encounter addended by: Eulis Foster, MD on: 09/16/2012 10:42 AM<BR>     Documentation filed: Notes Section

## 2012-09-16 NOTE — Progress Notes (Addendum)
[facility] ultrasound  Indication: 26 yr old G1P0 at [redacted]w[redacted]d for follow up ultrasound secondary to fetal pyelectasis and ventriculomegaly.  Findings: 1. Single intrauterine pregnancy. 2. Estimated fetal weight is in the 60th%. 3. Posterior placenta without evidence of previa. 4. Normal amniotic fluid index; although is increased for gestational age. 5. Again seen is mild right ventriculomegaly; the righ lateral ventricle measures 1.08cm; the left ventricle is normal at 0.9cm. 6. The remainder of the limited anatomy survey is normal. The fetal kidneys are normal. 7. Again seen is a right ovarian cyst measuring 8x4cm.  Recommendations: 1. Appropriate fetal growth. 2. Mild ventriculomegaly: - previously counseled - had normal Harmony screen - declined prenatal consult with Pediatric Neurosurgery - will reevaluate fetal ventricles in 2 weeks; if normal no further follow up indicated; if enlarged will need postnatal follow up  3. Slightly increased amniotic fluid index: - patient reports normal glucola - recommend follow up in 2 weeks to recheck AFI 4. Pyelectasis is resolved 5. Ovarian cyst: - previously counseled - patient asymptomatic - recommend follow up postpartum  Eulis Foster, MD

## 2012-09-28 ENCOUNTER — Ambulatory Visit (INDEPENDENT_AMBULATORY_CARE_PROVIDER_SITE_OTHER): Payer: BC Managed Care – PPO | Admitting: Pediatrics

## 2012-09-28 DIAGNOSIS — Z7681 Expectant parent(s) prebirth pediatrician visit: Secondary | ICD-10-CM

## 2012-09-30 ENCOUNTER — Other Ambulatory Visit (HOSPITAL_COMMUNITY): Payer: Self-pay | Admitting: Obstetrics and Gynecology

## 2012-09-30 ENCOUNTER — Ambulatory Visit (HOSPITAL_COMMUNITY)
Admission: RE | Admit: 2012-09-30 | Discharge: 2012-09-30 | Disposition: A | Discharge status: Home / Self Care | Payer: BC Managed Care – PPO | Source: Ambulatory Visit | Attending: Obstetrics and Gynecology | Admitting: Obstetrics and Gynecology

## 2012-09-30 DIAGNOSIS — Z1389 Encounter for screening for other disorder: Secondary | ICD-10-CM | POA: Insufficient documentation

## 2012-09-30 DIAGNOSIS — IMO0002 Reserved for concepts with insufficient information to code with codable children: Secondary | ICD-10-CM

## 2012-09-30 DIAGNOSIS — N83209 Unspecified ovarian cyst, unspecified side: Secondary | ICD-10-CM | POA: Insufficient documentation

## 2012-09-30 DIAGNOSIS — O3500X0 Maternal care for (suspected) central nervous system malformation or damage in fetus, unspecified, not applicable or unspecified: Secondary | ICD-10-CM | POA: Insufficient documentation

## 2012-09-30 DIAGNOSIS — O358XX0 Maternal care for other (suspected) fetal abnormality and damage, not applicable or unspecified: Secondary | ICD-10-CM | POA: Insufficient documentation

## 2012-09-30 DIAGNOSIS — O34599 Maternal care for other abnormalities of gravid uterus, unspecified trimester: Secondary | ICD-10-CM | POA: Insufficient documentation

## 2012-09-30 DIAGNOSIS — Z363 Encounter for antenatal screening for malformations: Secondary | ICD-10-CM | POA: Insufficient documentation

## 2012-09-30 DIAGNOSIS — O350XX0 Maternal care for (suspected) central nervous system malformation in fetus, not applicable or unspecified: Secondary | ICD-10-CM | POA: Insufficient documentation

## 2012-09-30 DIAGNOSIS — O3440 Maternal care for other abnormalities of cervix, unspecified trimester: Secondary | ICD-10-CM | POA: Insufficient documentation

## 2012-09-30 NOTE — Progress Notes (Signed)
[facility] ultrasound  Indication: 26 yr old G1P0 at [redacted]w[redacted]d for follow up ultrasound secondary to fetal ventriculomegaly.  Findings: 1. Single intrauterine pregnancy. 2.  Posterior placenta without evidence of previa. 3. Normal amniotic fluid index. 4. Again seen is mild right ventriculomegaly; the righ lateral ventricle measures 1.18cm; the left ventricle appears normal but is difficult to visualize. 5. The remainder of the limited anatomy survey is normal. The fetal kidneys are normal.  Recommendations: 1. Mild ventriculomegaly: - previously counseled - had normal Harmony screen - declined prenatal consult with Pediatric Neurosurgery - will need postnatal follow up - inform Pediatricians at time of delivery  2. Pyelectasis is resolved 3. Previously seen ovarian cyst: - previously counseled - patient asymptomatic - recommend follow up postpartum  Eulis Foster, MD

## 2012-10-11 LAB — OB RESULTS CONSOLE GBS: GBS: NEGATIVE

## 2012-10-27 NOTE — L&D Delivery Note (Signed)
  Operative Delivery Note After pushing well for 20-30 minutes, with severe variables, d/w pt vacuum assistance, 3 pulls, 2 pops with Kiwi and 1 pull, zero pops.  At 9:01 PM a viable female was delivered via Vaginal, Vacuum Investment banker, operational).  Presentation: vertex; Position: Left,, Occiput,, Anterior; Station: +2/3.  Verbal consent: obtained from patient.  Risks and benefits discussed in detail.  Risks include, but are not limited to the risks of anesthesia, bleeding, infection, damage to maternal tissues, fetal cephalhematoma.  There is also the risk of inability to effect vaginal delivery of the head, or shoulder dystocia that cannot be resolved by established maneuvers, leading to the need for emergency cesarean section.  APGAR: 8, 9; weight P.   Placenta status: Intact, Expressed.   Cord: 3 vessels with the following complications: Nuchal   Anesthesia: Epidural  Instruments: Arrie Senate Episiotomy: None Lacerations: 2nd degree Suture Repair: 3.0 vicryl rapide Est. Blood Loss (mL): 400cc  Mom to postpartum.  Baby to stay with mommy.  Jasmine Wolfe,Jasmine Wolfe 11/08/2012, 9:36 PM  D/W pt and family circumcision for female infant, including r/b/a, wishes to proceed O+/ Br/ POPs/ RI

## 2012-11-05 ENCOUNTER — Inpatient Hospital Stay (HOSPITAL_COMMUNITY): Admission: AD | Admit: 2012-11-05 | Payer: Self-pay | Source: Ambulatory Visit | Admitting: Obstetrics and Gynecology

## 2012-11-05 ENCOUNTER — Telehealth (HOSPITAL_COMMUNITY): Payer: Self-pay | Admitting: *Deleted

## 2012-11-05 ENCOUNTER — Encounter (HOSPITAL_COMMUNITY): Payer: Self-pay | Admitting: *Deleted

## 2012-11-05 NOTE — Telephone Encounter (Signed)
Preadmission screen  

## 2012-11-06 ENCOUNTER — Encounter (HOSPITAL_COMMUNITY): Payer: Self-pay

## 2012-11-06 DIAGNOSIS — IMO0002 Reserved for concepts with insufficient information to code with codable children: Secondary | ICD-10-CM

## 2012-11-06 DIAGNOSIS — Z3400 Encounter for supervision of normal first pregnancy, unspecified trimester: Secondary | ICD-10-CM

## 2012-11-06 HISTORY — DX: Reserved for concepts with insufficient information to code with codable children: IMO0002

## 2012-11-06 MED ORDER — PRENATAL MULTIVITAMIN CH: 1.0000 | ORAL_TABLET | Freq: Every day | ORAL | Status: DC

## 2012-11-06 NOTE — H&P (Signed)
Jasmine Wolfe is a 27 y.o. female G1P0 at 64+ for IOL given post EDC, nonfavorable cervix.  Pregnancy complicated by mild pyelectasis, has resolved and mild ventriculomegaly, followed by MFM and ULN AFI.  Harmony low risk.  Started [facility] in [place], transferred at 25 week.  Also complicated by flu/CAP in 10/13.  D/W pt r/b/a of IOL  Maternal Medical History:  Contractions: Frequency: irregular.      OB History    Grav Para Term Preterm Abortions TAB SAB Ect Mult Living   1 0 0 0 0 0 0 0 0 0     G1 present, h/o abn pap, nl since.  No STDs  Past Medical History  Diagnosis Date  . Influenza   . No pertinent past medical history   . Ovarian cyst     persistant 4x4x7cm one septation  . 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 Normal pregnancy, first 11/06/2012  HA - frequent has had ENT eval  Past Surgical History  Procedure Date  . Wisdom tooth extraction    Family History: family history includes Anesthesia problems in her mother; Cancer in her maternal grandmother; Depression in her father; Hyperlipidemia in her father; Hypertension in her sister; and Other in her mother. Social History:  reports that she has never smoked. She has never used smokeless tobacco. She reports that she does not drink alcohol or use illicit drugs.Married Meds PNV All NKDA  Prenatal Transfer Tool  Maternal Diabetes: No Genetic Screening: Normal Maternal Ultrasounds/Referrals: Abnormal:  Findings:   Fetal renal pyelectasis, Other:cerebral ventriculomegaly Fetal Ultrasounds or other Referrals:  Referred to Materal Fetal Medicine  Maternal Substance Abuse:  No Significant Maternal Medications:  None Significant Maternal Lab Results:  Lab values include: Group B Strep negative Other Comments:  maternal CAP 10/13, mild pyelectasis - resolved, cerbral ventriculomegaly  Review of Systems  Constitutional: Negative.   HENT: Negative.   Eyes: Negative.   Respiratory: Negative.   Cardiovascular: Negative.     Gastrointestinal: Negative.   Genitourinary: Negative.   Musculoskeletal: Negative.   Skin: Negative.   Neurological: Negative.   Psychiatric/Behavioral: Negative.       Last menstrual period 01/18/2012. Maternal Exam:  Uterine Assessment: Contraction frequency is irregular.   Abdomen: Fundal height is appropriate for gestation.   Estimated fetal weight is 8#.   Fetal presentation: vertex  Introitus: Normal vulva. Normal vagina.  Pelvis: adequate for delivery.   Cervix: Cervix evaluated by digital exam.     Physical Exam  Constitutional: She is oriented to person, place, and time. She appears well-developed and well-nourished.  Cardiovascular: Normal rate and regular rhythm.   Respiratory: Effort normal and breath sounds normal. No respiratory distress.  GI: Soft. Bowel sounds are normal. There is no tenderness.  Musculoskeletal: Normal range of motion.  Neurological: She is alert and oriented to person, place, and time.  Skin: Skin is warm and dry.  Psychiatric: She has a normal mood and affect. Her behavior is normal.    Prenatal labs: ABO, Rh: --/--/O POS (10/24 1610) Antibody: NEG (10/24 9604) Rubella: Immune (05/28 0000) RPR: Nonreactive (05/28 0000)  HBsAg: Negative (05/28 0000)  HIV: Non-reactive (05/28 0000)  GBS: Negative (12/16 0000)  Hgb12.0/ Pap WNL/ Ur Cx + E coli UTI/ Plt 355K/ VzIg neg/ GC neg/ Chl neg/ glucola 111  EDC 1/10 - pyelectasis improved, mild ventriculomegaly, post placenta, female MFM eval and followed by them  Persistent R ovarian cyst  Harmony low-risk Transfer care at 25 week  Assessment/Plan:  26yo G1P0 at 40+ for iol cytotec to ripen, AROM and pitocin gbbs negative Epidural or IV pain meds for pain If delivered by LTCS eval ov cyst     BOVARD,Agastya Meister 11/06/2012, 8:38 PM

## 2012-11-07 ENCOUNTER — Encounter (HOSPITAL_COMMUNITY): Payer: Self-pay

## 2012-11-07 ENCOUNTER — Inpatient Hospital Stay (HOSPITAL_COMMUNITY): Payer: BC Managed Care – PPO

## 2012-11-07 ENCOUNTER — Inpatient Hospital Stay (HOSPITAL_COMMUNITY)
Admission: RE | Admit: 2012-11-07 | Discharge: 2012-11-10 | DRG: 373 | Disposition: A | Discharge status: Home / Self Care | Payer: BC Managed Care – PPO | Source: Ambulatory Visit | Attending: Obstetrics and Gynecology | Admitting: Obstetrics and Gynecology

## 2012-11-07 VITALS — BP 122/80 | HR 87 | Temp 97.5°F | Resp 20 | Ht 66.0 in | Wt 268.0 lb

## 2012-11-07 DIAGNOSIS — IMO0002 Reserved for concepts with insufficient information to code with codable children: Secondary | ICD-10-CM

## 2012-11-07 DIAGNOSIS — O480 Post-term pregnancy: Principal | ICD-10-CM | POA: Diagnosis present

## 2012-11-07 DIAGNOSIS — O358XX0 Maternal care for other (suspected) fetal abnormality and damage, not applicable or unspecified: Secondary | ICD-10-CM | POA: Diagnosis present

## 2012-11-07 DIAGNOSIS — Z3400 Encounter for supervision of normal first pregnancy, unspecified trimester: Secondary | ICD-10-CM

## 2012-11-07 HISTORY — DX: Reserved for concepts with insufficient information to code with codable children: IMO0002

## 2012-11-07 HISTORY — DX: Unspecified abnormal cytological findings in specimens from cervix uteri: R87.619

## 2012-11-07 LAB — CBC
HCT: 33.3 % — ABNORMAL LOW (ref 36.0–46.0)
Hemoglobin: 11.1 g/dL — ABNORMAL LOW (ref 12.0–15.0)
MCH: 27.3 pg (ref 26.0–34.0)
MCHC: 33.3 g/dL (ref 30.0–36.0)
MCV: 82 fL (ref 78.0–100.0)
RDW: 13.9 % (ref 11.5–15.5)

## 2012-11-07 MED ORDER — LACTATED RINGERS IV SOLN
500.0000 mL | INTRAVENOUS | Status: DC | PRN
  Administered 2012-11-07: 500 mL via INTRAVENOUS

## 2012-11-07 MED ORDER — LIDOCAINE HCL (PF) 1 % IJ SOLN: 30.0000 mL | INTRAMUSCULAR | Status: DC | PRN

## 2012-11-07 MED ORDER — OXYCODONE-ACETAMINOPHEN 5-325 MG PO TABS: 1.0000 | ORAL_TABLET | ORAL | Status: DC | PRN

## 2012-11-07 MED ORDER — MISOPROSTOL 25 MCG QUARTER TABLET
25.0000 ug | ORAL_TABLET | ORAL | Status: DC | PRN
  Administered 2012-11-07 – 2012-11-08 (×3): 25 ug via VAGINAL
  Filled 2012-11-07 (×3): qty 0.25

## 2012-11-07 MED ORDER — OXYTOCIN BOLUS FROM INFUSION: 500.0000 mL | INTRAVENOUS | Status: DC

## 2012-11-07 MED ORDER — BUTORPHANOL TARTRATE 1 MG/ML IJ SOLN: 2.0000 mg | INTRAMUSCULAR | Status: DC | PRN

## 2012-11-07 MED ORDER — TERBUTALINE SULFATE 1 MG/ML IJ SOLN: 0.2500 mg | Freq: Once | INTRAMUSCULAR | Status: AC | PRN

## 2012-11-07 MED ORDER — OXYTOCIN 40 UNITS IN LACTATED RINGERS INFUSION - SIMPLE MED: 62.5000 mL/h | INTRAVENOUS | Status: DC

## 2012-11-07 MED ORDER — CITRIC ACID-SODIUM CITRATE 334-500 MG/5ML PO SOLN
30.0000 mL | ORAL | Status: DC | PRN
  Administered 2012-11-08: 30 mL via ORAL
  Filled 2012-11-07: qty 15

## 2012-11-07 MED ORDER — ONDANSETRON HCL 4 MG/2ML IJ SOLN: 4.0000 mg | Freq: Four times a day (QID) | INTRAMUSCULAR | Status: DC | PRN

## 2012-11-07 MED ORDER — ACETAMINOPHEN 325 MG PO TABS: 650.0000 mg | ORAL_TABLET | ORAL | Status: DC | PRN

## 2012-11-07 MED ORDER — IBUPROFEN 600 MG PO TABS: 600.0000 mg | ORAL_TABLET | Freq: Four times a day (QID) | ORAL | Status: DC | PRN

## 2012-11-07 MED ORDER — LACTATED RINGERS IV SOLN
INTRAVENOUS | Status: DC
  Administered 2012-11-07 – 2012-11-08 (×3): via INTRAVENOUS

## 2012-11-07 MED ORDER — OXYTOCIN 40 UNITS IN LACTATED RINGERS INFUSION - SIMPLE MED
1.0000 m[IU]/min | INTRAVENOUS | Status: DC
  Administered 2012-11-08: 2 m[IU]/min via INTRAVENOUS
  Filled 2012-11-07: qty 1000

## 2012-11-07 NOTE — Progress Notes (Signed)
Informed dr Ambrose Mantle of fhr, [place] report, and elevated b/p's.  Orders received

## 2012-11-07 NOTE — Progress Notes (Signed)
This note also relates to the following rows which could not be included: Pulse Rate - Cannot attach notes to unvalidated device data SpO2 - Cannot attach notes to unvalidated device data   Informed MD of decels, orders to hold cytotec for now.

## 2012-11-07 NOTE — Progress Notes (Signed)
This note also relates to the following rows which could not be included: Pulse Rate - Cannot attach notes to unvalidated device data SpO2 - Cannot attach notes to unvalidated device data   [place] in room checking for fetal presentation

## 2012-11-08 ENCOUNTER — Encounter (HOSPITAL_COMMUNITY)
Admission: RE | Disposition: A | Discharge status: Home / Self Care | Payer: Self-pay | Source: Ambulatory Visit | Attending: Obstetrics and Gynecology

## 2012-11-08 ENCOUNTER — Inpatient Hospital Stay (HOSPITAL_COMMUNITY): Payer: BC Managed Care – PPO | Admitting: Anesthesiology

## 2012-11-08 ENCOUNTER — Encounter (HOSPITAL_COMMUNITY): Payer: Self-pay

## 2012-11-08 ENCOUNTER — Encounter (HOSPITAL_COMMUNITY): Payer: Self-pay | Admitting: Anesthesiology

## 2012-11-08 LAB — COMPREHENSIVE METABOLIC PANEL
ALT: 10 U/L (ref 0–35)
AST: 14 U/L (ref 0–37)
Alkaline Phosphatase: 173 U/L — ABNORMAL HIGH (ref 39–117)
CO2: 20 mEq/L (ref 19–32)
Calcium: 9.1 mg/dL (ref 8.4–10.5)
Potassium: 3.9 mEq/L (ref 3.5–5.1)
Sodium: 135 mEq/L (ref 135–145)
Total Protein: 6.2 g/dL (ref 6.0–8.3)

## 2012-11-08 LAB — URINALYSIS, DIPSTICK ONLY
Bilirubin Urine: NEGATIVE
Specific Gravity, Urine: 1.015 (ref 1.005–1.030)
pH: 6 (ref 5.0–8.0)

## 2012-11-08 SURGERY — Surgical Case
Anesthesia: Spinal | Site: Abdomen | Wound class: Clean Contaminated

## 2012-11-08 MED ORDER — FENTANYL CITRATE 0.05 MG/ML IJ SOLN
100.0000 ug | Freq: Once | INTRAMUSCULAR | Status: AC
  Administered 2012-11-08: 100 ug via EPIDURAL

## 2012-11-08 MED ORDER — SIMETHICONE 80 MG PO CHEW: 80.0000 mg | CHEWABLE_TABLET | ORAL | Status: DC | PRN

## 2012-11-08 MED ORDER — DIPHENHYDRAMINE HCL 50 MG/ML IJ SOLN: 12.5000 mg | INTRAMUSCULAR | Status: DC | PRN

## 2012-11-08 MED ORDER — PHENYLEPHRINE 40 MCG/ML (10ML) SYRINGE FOR IV PUSH (FOR BLOOD PRESSURE SUPPORT): 80.0000 ug | PREFILLED_SYRINGE | INTRAVENOUS | Status: DC | PRN

## 2012-11-08 MED ORDER — ONDANSETRON HCL 4 MG/2ML IJ SOLN: 4.0000 mg | Freq: Four times a day (QID) | INTRAMUSCULAR | Status: DC | PRN

## 2012-11-08 MED ORDER — DIPHENHYDRAMINE HCL 25 MG PO CAPS: 25.0000 mg | ORAL_CAPSULE | Freq: Four times a day (QID) | ORAL | Status: DC | PRN

## 2012-11-08 MED ORDER — LACTATED RINGERS IV SOLN: INTRAVENOUS | Status: DC

## 2012-11-08 MED ORDER — CITRIC ACID-SODIUM CITRATE 334-500 MG/5ML PO SOLN: 30.0000 mL | ORAL | Status: DC | PRN

## 2012-11-08 MED ORDER — IBUPROFEN 600 MG PO TABS
600.0000 mg | ORAL_TABLET | Freq: Four times a day (QID) | ORAL | Status: DC | PRN
  Administered 2012-11-08: 600 mg via ORAL
  Filled 2012-11-08: qty 1

## 2012-11-08 MED ORDER — LIDOCAINE HCL (PF) 1 % IJ SOLN
INTRAMUSCULAR | Status: DC | PRN
  Administered 2012-11-08 (×5): 4 mL
  Administered 2012-11-08: 5 mL
  Administered 2012-11-08 (×4): 4 mL

## 2012-11-08 MED ORDER — EPHEDRINE 5 MG/ML INJ: 10.0000 mg | INTRAVENOUS | Status: DC | PRN

## 2012-11-08 MED ORDER — BUPIVACAINE HCL (PF) 0.25 % IJ SOLN
INTRAMUSCULAR | Status: DC | PRN
  Administered 2012-11-08 (×2): 5 mL

## 2012-11-08 MED ORDER — LANOLIN HYDROUS EX OINT: TOPICAL_OINTMENT | CUTANEOUS | Status: DC | PRN

## 2012-11-08 MED ORDER — LACTATED RINGERS IV SOLN
500.0000 mL | Freq: Once | INTRAVENOUS | Status: AC
  Administered 2012-11-08: 500 mL via INTRAVENOUS

## 2012-11-08 MED ORDER — FENTANYL 2.5 MCG/ML BUPIVACAINE 1/10 % EPIDURAL INFUSION (WH - ANES)
INTRAMUSCULAR | Status: AC
  Administered 2012-11-08: 14 mL/h via EPIDURAL
  Filled 2012-11-08: qty 125

## 2012-11-08 MED ORDER — FENTANYL CITRATE 0.05 MG/ML IJ SOLN
INTRAMUSCULAR | Status: AC
  Filled 2012-11-08: qty 2

## 2012-11-08 MED ORDER — SENNOSIDES-DOCUSATE SODIUM 8.6-50 MG PO TABS
2.0000 | ORAL_TABLET | Freq: Every day | ORAL | Status: DC
  Administered 2012-11-09: 2 via ORAL

## 2012-11-08 MED ORDER — CITRIC ACID-SODIUM CITRATE 334-500 MG/5ML PO SOLN
ORAL | Status: AC
  Filled 2012-11-08: qty 15

## 2012-11-08 MED ORDER — LACTATED RINGERS IV SOLN: 500.0000 mL | Freq: Once | INTRAVENOUS | Status: DC

## 2012-11-08 MED ORDER — PHENYLEPHRINE 40 MCG/ML (10ML) SYRINGE FOR IV PUSH (FOR BLOOD PRESSURE SUPPORT)
80.0000 ug | PREFILLED_SYRINGE | INTRAVENOUS | Status: DC | PRN
  Filled 2012-11-08: qty 5

## 2012-11-08 MED ORDER — OXYTOCIN BOLUS FROM INFUSION: 500.0000 mL | INTRAVENOUS | Status: DC

## 2012-11-08 MED ORDER — TETANUS-DIPHTH-ACELL PERTUSSIS 5-2.5-18.5 LF-MCG/0.5 IM SUSP: 0.5000 mL | Freq: Once | INTRAMUSCULAR | Status: DC

## 2012-11-08 MED ORDER — OXYTOCIN 40 UNITS IN LACTATED RINGERS INFUSION - SIMPLE MED
62.5000 mL/h | INTRAVENOUS | Status: DC
  Administered 2012-11-08: 62.5 mL/h via INTRAVENOUS

## 2012-11-08 MED ORDER — ONDANSETRON HCL 4 MG/2ML IJ SOLN: 4.0000 mg | INTRAMUSCULAR | Status: DC | PRN

## 2012-11-08 MED ORDER — DIBUCAINE 1 % RE OINT: 1.0000 "application " | TOPICAL_OINTMENT | RECTAL | Status: DC | PRN

## 2012-11-08 MED ORDER — ACETAMINOPHEN 325 MG PO TABS: 650.0000 mg | ORAL_TABLET | ORAL | Status: DC | PRN

## 2012-11-08 MED ORDER — IBUPROFEN 600 MG PO TABS
600.0000 mg | ORAL_TABLET | Freq: Four times a day (QID) | ORAL | Status: DC
  Administered 2012-11-09 – 2012-11-10 (×6): 600 mg via ORAL
  Filled 2012-11-08 (×6): qty 1

## 2012-11-08 MED ORDER — FENTANYL 2.5 MCG/ML BUPIVACAINE 1/10 % EPIDURAL INFUSION (WH - ANES)
14.0000 mL/h | INTRAMUSCULAR | Status: DC
  Administered 2012-11-08: 14 mL/h via EPIDURAL

## 2012-11-08 MED ORDER — BENZOCAINE-MENTHOL 20-0.5 % EX AERO
1.0000 "application " | INHALATION_SPRAY | CUTANEOUS | Status: DC | PRN
  Administered 2012-11-09: 1 via TOPICAL
  Filled 2012-11-08: qty 56

## 2012-11-08 MED ORDER — SODIUM BICARBONATE 8.4 % IV SOLN
INTRAVENOUS | Status: DC | PRN
  Administered 2012-11-08: 5 mL via EPIDURAL

## 2012-11-08 MED ORDER — WITCH HAZEL-GLYCERIN EX PADS: 1.0000 "application " | MEDICATED_PAD | CUTANEOUS | Status: DC | PRN

## 2012-11-08 MED ORDER — LIDOCAINE HCL (PF) 1 % IJ SOLN: 30.0000 mL | INTRAMUSCULAR | Status: DC | PRN

## 2012-11-08 MED ORDER — EPHEDRINE 5 MG/ML INJ
10.0000 mg | INTRAVENOUS | Status: DC | PRN
  Filled 2012-11-08: qty 4

## 2012-11-08 MED ORDER — OXYTOCIN 40 UNITS IN LACTATED RINGERS INFUSION - SIMPLE MED
1.0000 m[IU]/min | INTRAVENOUS | Status: DC
  Administered 2012-11-08: 2 m[IU]/min via INTRAVENOUS

## 2012-11-08 MED ORDER — PRENATAL MULTIVITAMIN CH
1.0000 | ORAL_TABLET | Freq: Every day | ORAL | Status: DC
  Administered 2012-11-09 – 2012-11-10 (×2): 1 via ORAL
  Filled 2012-11-08 (×2): qty 1

## 2012-11-08 MED ORDER — ZOLPIDEM TARTRATE 5 MG PO TABS: 5.0000 mg | ORAL_TABLET | Freq: Every evening | ORAL | Status: DC | PRN

## 2012-11-08 MED ORDER — OXYCODONE-ACETAMINOPHEN 5-325 MG PO TABS
1.0000 | ORAL_TABLET | ORAL | Status: DC | PRN
  Administered 2012-11-09 (×3): 1 via ORAL
  Filled 2012-11-08 (×3): qty 1

## 2012-11-08 MED ORDER — ONDANSETRON HCL 4 MG PO TABS: 4.0000 mg | ORAL_TABLET | ORAL | Status: DC | PRN

## 2012-11-08 MED ORDER — OXYCODONE-ACETAMINOPHEN 5-325 MG PO TABS
1.0000 | ORAL_TABLET | ORAL | Status: DC | PRN
  Administered 2012-11-08: 1 via ORAL
  Filled 2012-11-08: qty 1

## 2012-11-08 MED ORDER — FENTANYL 2.5 MCG/ML BUPIVACAINE 1/10 % EPIDURAL INFUSION (WH - ANES)
14.0000 mL/h | INTRAMUSCULAR | Status: DC
  Administered 2012-11-08: 14 mL/h via EPIDURAL
  Filled 2012-11-08: qty 125

## 2012-11-08 MED ORDER — LACTATED RINGERS IV SOLN: 500.0000 mL | INTRAVENOUS | Status: DC | PRN

## 2012-11-08 MED ORDER — OXYTOCIN 10 UNIT/ML IJ SOLN
INTRAMUSCULAR | Status: AC
  Filled 2012-11-08: qty 4

## 2012-11-08 MED ORDER — TERBUTALINE SULFATE 1 MG/ML IJ SOLN: 0.2500 mg | Freq: Once | INTRAMUSCULAR | Status: DC | PRN

## 2012-11-08 SURGICAL SUPPLY — 36 items
APL SKNCLS STERI-STRIP NONHPOA (GAUZE/BANDAGES/DRESSINGS)
BENZOIN TINCTURE PRP APPL 2/3 (GAUZE/BANDAGES/DRESSINGS) IMPLANT
CLOTH BEACON ORANGE TIMEOUT ST (SAFETY) ×1 IMPLANT
CONTAINER PREFILL 10% NBF 15ML (MISCELLANEOUS) IMPLANT
DRAPE LG THREE QUARTER DISP (DRAPES) ×1 IMPLANT
DRSG OPSITE POSTOP 4X10 (GAUZE/BANDAGES/DRESSINGS) ×1 IMPLANT
DURAPREP 26ML APPLICATOR (WOUND CARE) ×1 IMPLANT
ELECT REM PT RETURN 9FT ADLT (ELECTROSURGICAL)
ELECTRODE REM PT RTRN 9FT ADLT (ELECTROSURGICAL) ×1 IMPLANT
EXTRACTOR VACUUM M CUP 4 TUBE (SUCTIONS) IMPLANT
GLOVE BIO SURGEON STRL SZ 6.5 (GLOVE) ×1 IMPLANT
GLOVE BIO SURGEON STRL SZ7 (GLOVE) ×1 IMPLANT
GOWN PREVENTION PLUS LG XLONG (DISPOSABLE) ×3 IMPLANT
KIT ABG SYR 3ML LUER SLIP (SYRINGE) IMPLANT
NDL HYPO 25X5/8 SAFETYGLIDE (NEEDLE) IMPLANT
NEEDLE HYPO 25X5/8 SAFETYGLIDE (NEEDLE) IMPLANT
NS IRRIG 1000ML POUR BTL (IV SOLUTION) ×1 IMPLANT
PACK C SECTION WH (CUSTOM PROCEDURE TRAY) ×1 IMPLANT
PAD OB MATERNITY 4.3X12.25 (PERSONAL CARE ITEMS) ×1 IMPLANT
RTRCTR C-SECT PINK 25CM LRG (MISCELLANEOUS) ×1 IMPLANT
SLEEVE SCD COMPRESS KNEE MED (MISCELLANEOUS) IMPLANT
STAPLER VISISTAT 35W (STAPLE) IMPLANT
STRIP CLOSURE SKIN 1/2X4 (GAUZE/BANDAGES/DRESSINGS) IMPLANT
SUT MNCRL 0 VIOLET CTX 36 (SUTURE) ×2 IMPLANT
SUT MONOCRYL 0 CTX 36 (SUTURE)
SUT PLAIN 1 NONE 54 (SUTURE) IMPLANT
SUT PLAIN 2 0 XLH (SUTURE) ×1 IMPLANT
SUT VIC AB 0 CT1 27 (SUTURE)
SUT VIC AB 0 CT1 27XBRD ANBCTR (SUTURE) ×2 IMPLANT
SUT VIC AB 2-0 CT1 27 (SUTURE)
SUT VIC AB 2-0 CT1 TAPERPNT 27 (SUTURE) ×1 IMPLANT
SUT VIC AB 4-0 KS 27 (SUTURE) IMPLANT
SYR BULB IRRIGATION 50ML (SYRINGE) ×1 IMPLANT
TOWEL OR 17X24 6PK STRL BLUE (TOWEL DISPOSABLE) ×3 IMPLANT
TRAY FOLEY CATH 14FR (SET/KITS/TRAYS/PACK) ×1 IMPLANT
WATER STERILE IRR 1000ML POUR (IV SOLUTION) ×1 IMPLANT

## 2012-11-08 NOTE — Progress Notes (Addendum)
Patient ID: Jasmine Wolfe, female   DOB: August 02, 1986, 27 y.o.   MRN: [id number] Getting comfortable with epidural  FHTs 110's-120's, mod var.  With SROM and IUPC placement baby with decel to 90's toco diff to trace  Baby with decels to 90's, good variability, FSE placed.   Stat C/S called.   D/w pt r/b/a including but not limited to bleeding, infection, damage to surrounding organs, trouble healing, injury to infant.  Pt and FOB voice understanding.  While awaiting c/s FHTs improved to 130's, mod variability/ good variability.  C/s on hold  Anesthesia to replace Epidural

## 2012-11-08 NOTE — Progress Notes (Signed)
Patient ID: Jasmine Wolfe, female   DOB: 11-18-1985, 27 y.o.   MRN: [id number]  CTSP secondary to severe variables.  Pt baseline 130-140's, mod variability.  Variables with ctx.  Again d/w pt LTCS, inc r/b/a.  SVE 6/100/0-+1 IUPC placed without complications/problems  D/w pt progress, active labor. Will monitor progress, cautiously and closely  FHTs 140's, mod variability toco Q 3 min

## 2012-11-08 NOTE — Progress Notes (Addendum)
Patient ID: Jasmine Wolfe, female   DOB: [birth year], 27 y.o.   MRN: [id number] Pt comfortable with epidural  FHTs 130-140's, mod var.  Some variables toco q 3/100/0  Still in latent phase D/w pt, getting body into labor, POC

## 2012-11-08 NOTE — Anesthesia Preprocedure Evaluation (Signed)
Anesthesia Evaluation  Patient identified by MRN, date of birth, ID band Patient awake    Reviewed: Allergy & Precautions, H&P , NPO status , Patient's Chart, lab work & pertinent test results, reviewed documented beta blocker date and time   History of Anesthesia Complications Negative for: history of anesthetic complications  Airway Mallampati: II TM Distance: >3 FB Neck ROM: full    Dental  (+) Teeth Intact   Pulmonary pneumonia - (10/13 - influenza), resolved,  breath sounds clear to auscultation        Cardiovascular negative cardio ROS  Rhythm:regular Rate:Normal     Neuro/Psych negative neurological ROS  negative psych ROS   GI/Hepatic negative GI ROS, Neg liver ROS,   Endo/Other  Morbid obesity  Renal/GU negative Renal ROS     Musculoskeletal   Abdominal   Peds  Hematology negative hematology ROS (+)   Anesthesia Other Findings   Reproductive/Obstetrics (+) Pregnancy                           Anesthesia Physical Anesthesia Plan  ASA: III  Anesthesia Plan: Epidural   Post-op Pain Management:    Induction:   Airway Management Planned:   Additional Equipment:   Intra-op Plan:   Post-operative Plan:   Informed Consent: I have reviewed the patients History and Physical, chart, labs and discussed the procedure including the risks, benefits and alternatives for the proposed anesthesia with the patient or authorized representative who has indicated his/her understanding and acceptance.     Plan Discussed with:   Anesthesia Plan Comments:         Anesthesia Quick Evaluation

## 2012-11-08 NOTE — Progress Notes (Signed)
Patient ID: Jasmine Wolfe, female   DOB: [birth year], 27 y.o.   MRN: [id number]  H&P reviewed, no changes  FHTs 120's mod var, category 1 toco irr  cytotec ON Will start pitocin to augment

## 2012-11-08 NOTE — Progress Notes (Signed)
Dr. Ellyn Hack using kiwi vacuum

## 2012-11-08 NOTE — Anesthesia Procedure Notes (Addendum)
Epidural Patient location during procedure: OB Start time: 11/08/2012 12:52 PM  Staffing Performed by: anesthesiologist   Preanesthetic Checklist Completed: patient identified, site marked, surgical consent, pre-op evaluation, timeout performed, IV checked, risks and benefits discussed and monitors and equipment checked  Epidural Patient position: sitting Prep: site prepped and draped and DuraPrep Patient monitoring: continuous pulse ox and blood pressure Approach: midline Injection technique: LOR air  Needle:  Needle type: Tuohy  Needle gauge: 17 G Needle length: 9 cm and 9 Needle insertion depth: 7 cm Catheter type: closed end flexible Catheter size: 19 Gauge Catheter at skin depth: 12 cm Test dose: negative  Assessment Events: blood not aspirated, injection not painful, no injection resistance, negative IV test and no paresthesia  Additional Notes Discussed risk of headache, infection, bleeding, nerve injury and failed or incomplete block.  Patient voices understanding and wishes to proceed.  Epidural placed easily on first attempt.  No paresthesia.  Patient tolerated procedure well with no apparent complications.  Jasmine December, MDReason for block:procedure for pain  Epidural Patient location during procedure: OB Start time: 11/08/2012 7:50 PM  Staffing Anesthesiologist: Danton Palmateer A. Performed by: anesthesiologist   Preanesthetic Checklist Completed: patient identified, site marked, surgical consent, pre-op evaluation, timeout performed, IV checked, risks and benefits discussed and monitors and equipment checked  Epidural Patient position: sitting Prep: site prepped and draped and DuraPrep Patient monitoring: continuous pulse ox and blood pressure Approach: midline Injection technique: LOR air  Needle:  Needle type: Tuohy  Needle gauge: 17 G Needle length: 9 cm and 9 Needle insertion depth: 9 cm Catheter type: closed end flexible Catheter size: 19  Gauge Catheter at skin depth: 10 and 15 cm Test dose: negative and Other  Assessment Events: blood not aspirated, injection not painful, no injection resistance, negative IV test and no paresthesia  Additional Notes Patient identified. Risks and benefits discussed including failed block, incomplete  Pain control, post dural puncture headache, nerve damage, paralysis, blood pressure Changes, nausea, vomiting, reactions to medications-both toxic and allergic and post Partum back pain. All questions were answered. Patient expressed understanding and wished to proceed. Sterile technique was used throughout procedure. Epidural site was Dressed with sterile barrier dressing. No paresthesias, signs of intravascular injection Or signs of intrathecal spread were encountered.  Patient was more comfortable after the epidural was dosed. Please see RN's note for documentation of vital signs and FHR which are stable.

## 2012-11-08 NOTE — OR Nursing (Signed)
Pt was a stat but then was put on hold this time out pre procedural time out error in documentation was meant for an urgent c/s in room 9

## 2012-11-09 LAB — CBC
HCT: 28.3 % — ABNORMAL LOW (ref 36.0–46.0)
Hemoglobin: 9.3 g/dL — ABNORMAL LOW (ref 12.0–15.0)
MCHC: 32.9 g/dL (ref 30.0–36.0)
MCV: 82 fL (ref 78.0–100.0)

## 2012-11-09 LAB — CCBB MATERNAL DONOR DRAW

## 2012-11-09 NOTE — Anesthesia Postprocedure Evaluation (Signed)
  Anesthesia Post-op Note  Patient: Jasmine Wolfe  Procedure(s) Performed: * No procedures listed *  Patient Location: Mother/Baby  Anesthesia Type:Epidural  Level of Consciousness: awake, alert  and oriented  Airway and Oxygen Therapy: Patient Spontanous Breathing  Post-op Pain: none  Post-op Assessment: Patient's Cardiovascular Status Stable, Respiratory Function Stable, No headache, No backache, No residual numbness and No residual motor weakness  Post-op Vital Signs: stable  Complications: No apparent anesthesia complications

## 2012-11-09 NOTE — Progress Notes (Signed)
Post Partum Day 1 Subjective: no complaints, up ad lib, voiding, + flatus and nl lochia, pain controlled  Objective: Blood pressure 113/67, pulse 81, temperature 98.1 F (36.7 C), temperature source Oral, resp. rate 18, height 5\' 6"  (1.676 m), weight 121.564 kg (268 lb), last menstrual period 01/18/2012, SpO2 98.00%, unknown if currently breastfeeding.  Physical Exam:  General: alert and no distress Lochia: appropriate Uterine Fundus: firm   Basename 11/09/12 0540 11/07/12 2000  HGB 9.3* 11.1*  HCT 28.3* 33.3*    Assessment/Plan: Plan for discharge tomorrow, Breastfeeding and Lactation consult.  Doing well, routine care   LOS: 2 days   BOVARD,Tawyna Pellot 11/09/2012, 8:34 AM

## 2012-11-10 MED ORDER — PRENATAL MULTIVITAMIN CH: 1.0000 | ORAL_TABLET | Freq: Every day | ORAL | Status: DC

## 2012-11-10 MED ORDER — OXYCODONE-ACETAMINOPHEN 5-325 MG PO TABS: 1.0000 | ORAL_TABLET | Freq: Four times a day (QID) | ORAL | Status: DC | PRN

## 2012-11-10 MED ORDER — IBUPROFEN 800 MG PO TABS: 800.0000 mg | ORAL_TABLET | Freq: Three times a day (TID) | ORAL | Status: DC | PRN

## 2012-11-10 NOTE — Discharge Summary (Signed)
Obstetric Discharge Summary Reason for Admission: induction of labor Prenatal Procedures: none Intrapartum Procedures: vacuum Postpartum Procedures: none Complications-Operative and Postpartum: 2nd degree perineal laceration Hemoglobin  Date Value Range Status  11/09/2012 9.3* 12.0 - 15.0 g/dL Final     HCT  Date Value Range Status  11/09/2012 28.3* 36.0 - 46.0 % Final    Physical Exam:  General: alert and no distress Lochia: appropriate Uterine Fundus: firm  Discharge Diagnoses: Term Pregnancy-delivered  Discharge Information: Date: 11/10/2012 Activity: pelvic rest Diet: routine Medications: PNV, Ibuprofen and Percocet Condition: stable Instructions: refer to practice specific booklet Discharge to: home Follow-up Information    Follow up with BOVARD,Woodley Petzold, MD. Schedule an appointment as soon as possible for a visit in 6 weeks.   Contact information:   [address] [place] [address] [phone]          Newborn Data: Live born female  Birth Weight: 6 lb 7 oz (2920 g) APGAR: 8, 9  Home with mother.  BOVARD,Jailani Hogans 11/10/2012, 9:00 AM

## 2012-11-10 NOTE — Progress Notes (Signed)
Post Partum Day 2 Subjective: no complaints, up ad lib, voiding, tolerating PO and nl lochia, pain controlled  Objective: Blood pressure 122/80, pulse 87, temperature 97.5 F (36.4 C), temperature source Oral, resp. rate 20, height 5\' 6"  (1.676 m), weight 121.564 kg (268 lb), last menstrual period 01/18/2012, SpO2 98.00%, unknown if currently breastfeeding.  Physical Exam:  General: alert and no distress Lochia: appropriate Uterine Fundus: firm   Basename 11/09/12 0540 11/07/12 2000  HGB 9.3* 11.1*  HCT 28.3* 33.3*    Assessment/Plan: Discharge home, Breastfeeding and Lactation consult.  Circumcision this AM.  D/c with motrin/percocet/PNV, f/u 6 weeks.   LOS: 3 days   Wolfe,Jasmine Holian 11/10/2012, 8:29 AM

## 2012-11-11 LAB — TYPE AND SCREEN
ABO/RH(D): O POS
Unit division: 0
Unit division: 0

## 2012-12-11 ENCOUNTER — Other Ambulatory Visit: Payer: Self-pay

## 2013-02-17 ENCOUNTER — Ambulatory Visit (HOSPITAL_COMMUNITY)
Admission: RE | Admit: 2013-02-17 | Discharge: 2013-02-17 | Disposition: A | Discharge status: Home / Self Care | Payer: BC Managed Care – PPO | Source: Ambulatory Visit | Attending: Obstetrics and Gynecology | Admitting: Obstetrics and Gynecology

## 2013-02-17 NOTE — Lactation Note (Addendum)
Adult Lactation Consultation Outpatient Visit Note  Patient Name: Jasmine Wolfe Date of Birth: 10-Jan-1986 Gestational Age at Delivery: Unknown Type of Delivery:                Just mom came to the consult not baby for assessment with pumping  Reason for visit today - Per mom having challenges feeling like her breast are let-down  well with the pump at work. Breastfeeding History:- Per mom normal breast changes during pregnancy , milk came in , and no problems with engorgement. Baby is gaining well . @ about 6 weeks I've been making lactation cookies had have noticed a difference in breast fullness.  Frequency of Breastfeeding: 3 x's per day , once in the am ( both breast , after work , and the evening and seems satisfied after feeding.  Length of Feeding:  Supplementing / Method: As of present has needed to supplement with formula , but due to milk supply decrease will have to consider it.  Mom explained she has a history of milk intolerance and is concerned for the baby. LC recommended calling her Pedestrian and checking  what formula he would recommend if needed.  Pumping: Per mom has been post pumping 15 mins after the am feeding , 2 x's at work for 15 -20 mins and , once at 1000-1030p at home after the baby                  goes to sleep . ( also due to the fact the baby sleeps from 830p - 7am )       Type of Pump: DEBP ( Medela )       Frequency:       Volume:  Was 2 oz off each breast and has decreased to 1 -1 1/2 oz each , at night 1 oz off each. ( the best pumping was 5 oz total at one sitting, but that was weeks ago ).                   Flange size being used has been #27  Comments: @ consult LC  checked pressure on DEBP and the pressure range was 250-300 ( adequate )    Consultation Evaluation: Mom emotional at times when talking about her milk supply decreasing. Reassured mom this is common when moms go back to work and she  was taking the right steps to come in for a consult to  evaluate milk supply. Today mom is coming from work and iit has been 3 hours since she last pumped both breast - both breast feel full  No plugged ducts or engorgement noted , both nipples appear healthy . Mom is fair skinned. Since mom has been using the #27 flanges at home [facility] recommended trying to decrease flange size to a #24  Both breast and see if that makes a difference in the yield. Mom agreeable to that suggestion -  prior to the pump being turned had mom massage breast , hand express and then LC checked flanges, #24 had plenty of room on both sides of the nipples. Turned on the pump , mom tolerated the pressure being turned up and pumped for 15 mins with 2 oz yield front each breast , the last 5 mins had changed the flange back to #27 flange and the size was also comfortable ( did not appear to be to large either) . LC felt mom has tissue that can fit both sizes ,  but the #24 may give her increased areola stimulation that increases let down. Had mom hand express after pumping into the same bottles and mom had adequate technique. LC noted that both breast softer , also noted mom has heavy dense breast ( mom agreed pre- pregnant , pregnant and post partum ).   Total Breast milk Transferred with using the DEBP = 40 ml  Total Supplement Given:   Lactation Plan of Care - Praised mom for her Breast feeding and pumping efforts and multiply tasking  Returning back to work.                                         - Encouraged mom to Focus on "What she is providing for her baby and not what she isn't "                                         - Focus on the "Positive " - She identified a problem and she came in for help and working on the solution                                         - Per mom her baby is growing and happy                                         Continue post pumping after 1st breast feeding 10 -15 mins                                         @ work try to pump 2- 3 x's per day                                         - Prior to pumping breast massage , hand express, stand up bend forward and shake breast ( helps let down )                                           And at the end of pumping stand up and compress breast and finish pumping. Hand express after pumping recommended.                                         - Days you are only able to pump X2 at work , power pump in the evening after Cheree Ditto goes to sleep.                                        - Try #24 flange 3-4 days and see if there is a difference  in volume , if soreness occurs alternate #24 with #27.                                           ( may use olive oil or expressed ).    Follow-Up- Consider attending the evening breast feeding support group on the 2nd and 4th Monday evening 7pm .                   - Also call back if the Lactation plan of care isn't improving milk supply .       Kathrin Greathouse 02/17/2013, 5:12 PM

## 2013-09-01 ENCOUNTER — Other Ambulatory Visit: Payer: Self-pay

## 2013-10-19 IMAGING — US US OB LIMITED
1 series · 13 of 24 positions shown · non-contrast
Comparison: none

OBSTETRICS REPORT
                    (Corrected Final 09/30/2012 [DATE])

Service(s) Provided
 [HOSPITAL]                                         76815.0
Indications
 Cerebral ventriculomegaly (right)
 Pyelectasis of fetus on prenatal ultrasound           655.83 593.89,
 (resolved)
 Ovarian cyst (simple)
 Previous cervical surgery (colposcopy)
Fetal Evaluation
 Num Of Fetuses:    1
 Fetal Heart Rate:  152                          bpm
 Cardiac Activity:  Observed
 Presentation:      Cephalic
 Placenta:          Posterior, above cervical
                    os
 P. Cord            Previously Visualized
 Insertion:
 Amniotic Fluid
 AFI FV:      Subjectively within normal limits
 AFI Sum:     16.19   cm       59  %Tile     Larg Pckt:     5.5  cm
 RUQ:   4.16    cm   RLQ:    5.5    cm    LUQ:   4.28    cm   LLQ:    2.25   cm
Gestational Age
 LMP:           36w 4d        Date:  01/18/12                 EDD:   10/24/12
 Best:          34w 6d     Det. By:  Early Ultrasound         EDD:   11/05/12
                                     (03/23/12)
Anatomy
 Ventricles:       Ventriculomegaly,      Kidneys:          Appear normal
                   12mm
 Heart:            Appears normal         Bladder:          Appears normal
                   (4CH, axis, and
                   situs)
 Stomach:          Appears normal, left
                   sided
Cervix Uterus Adnexa
 Cervix:       Not visualized (advanced GA >85wks)
Impression
INDICATION: 26 yr old G1P0 at 16w3d for follow up ultrasound
 secondary to fetal ventriculomegaly.

[Series 1: us ob limited · 0.23mm/px · 24 acquisitions, 13 frames shown]
[im 1/24]
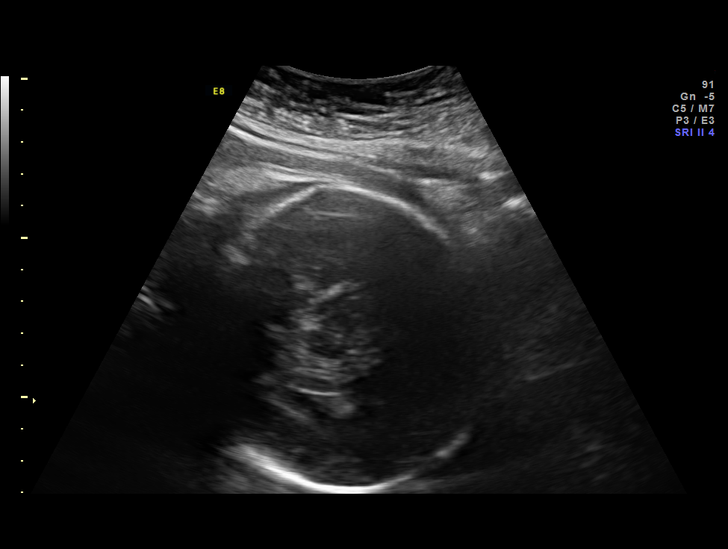
[im 3/24]
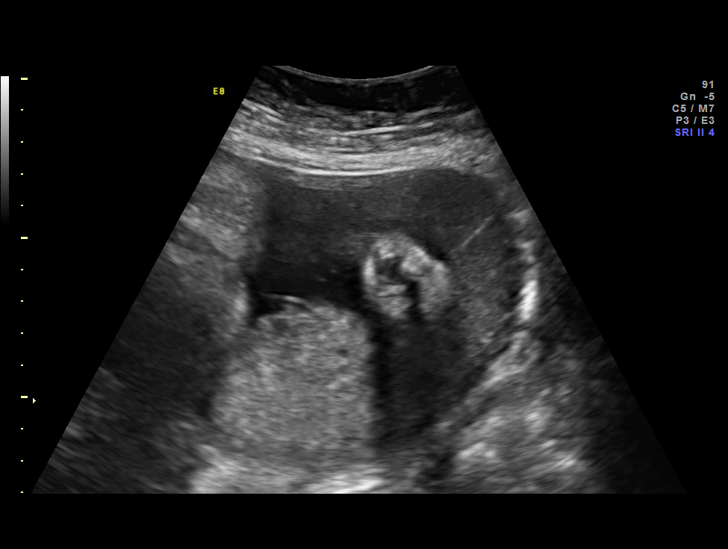
[im 5/24]
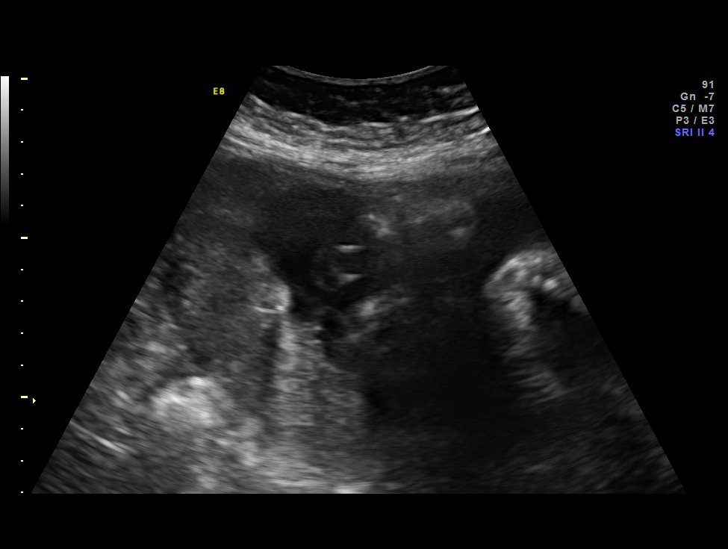
[im 7/24]
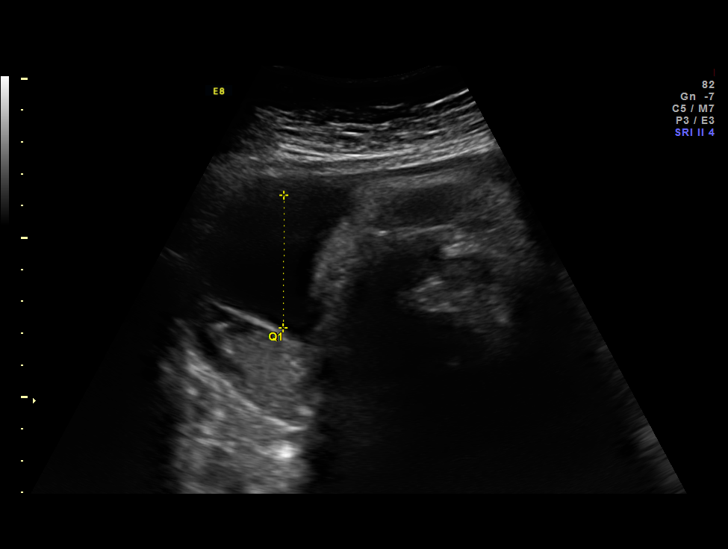
[im 9/24]
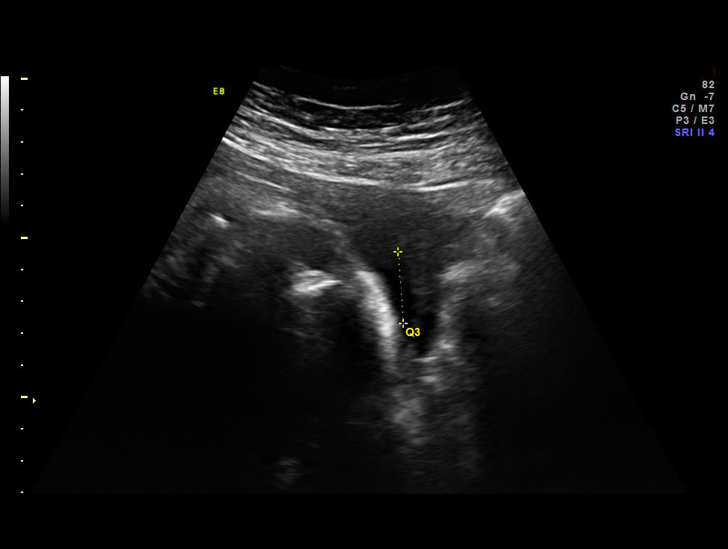
[im 11/24]
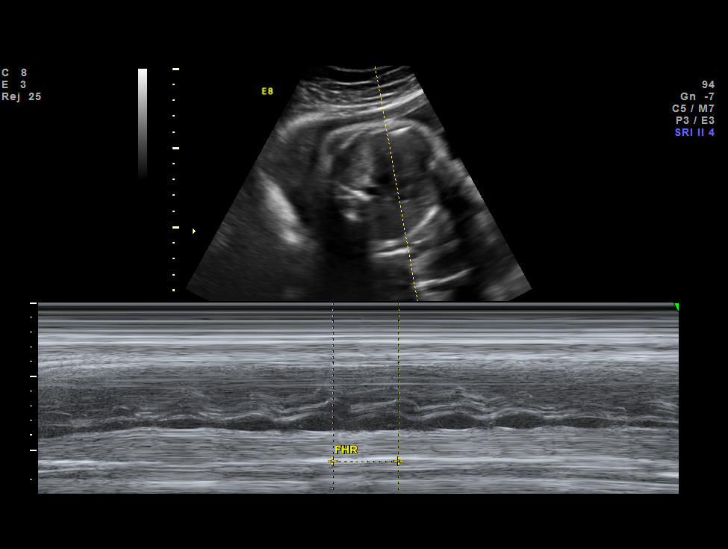
[im 13/24]
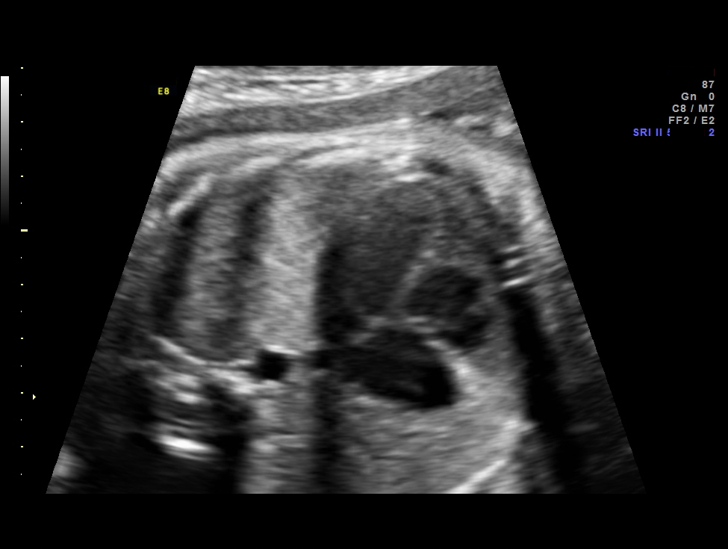
[im 14/24]
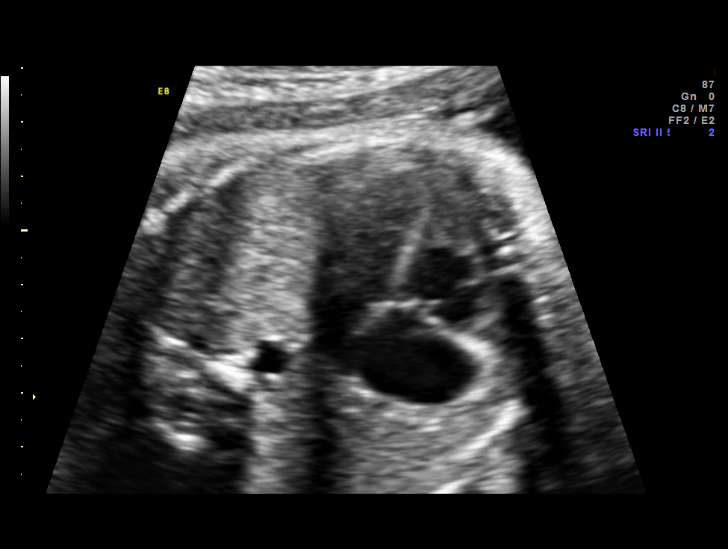
[im 16/24]
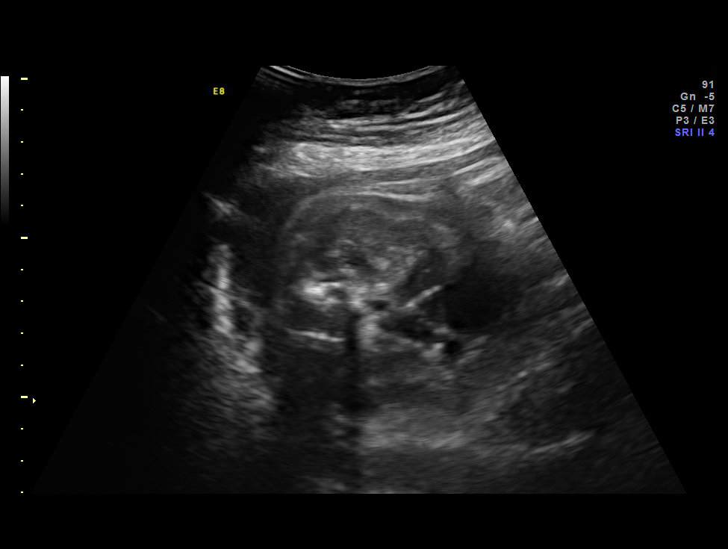
[im 18/24]
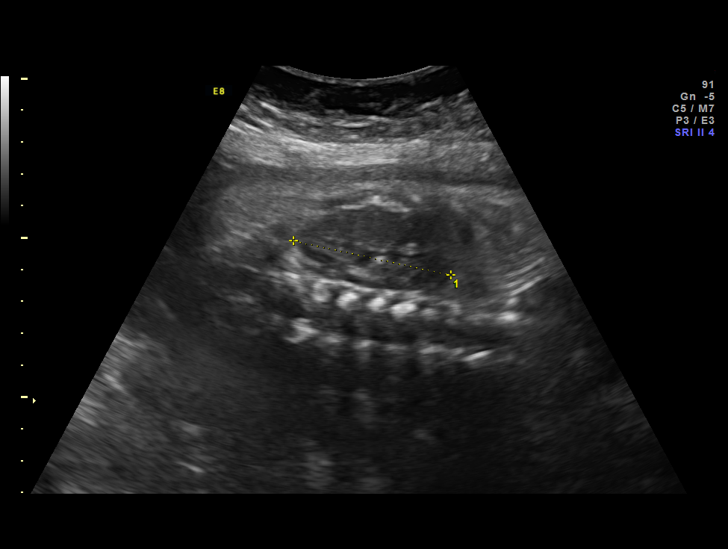
[im 20/24]
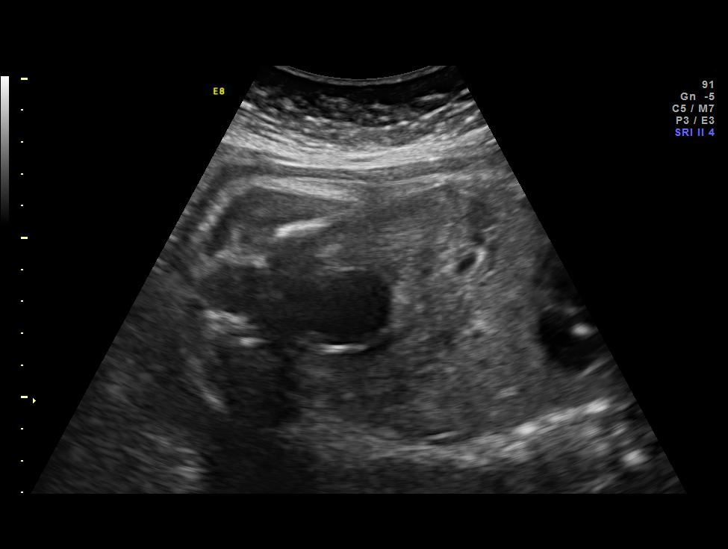
[im 22/24]
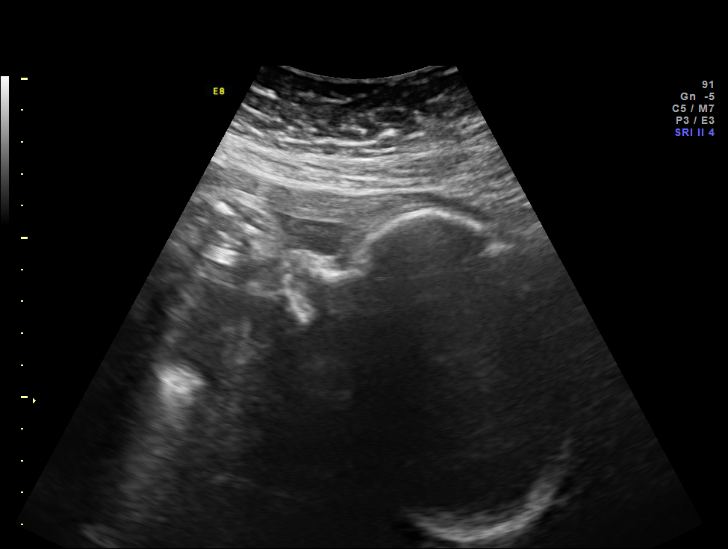
[im 24/24]
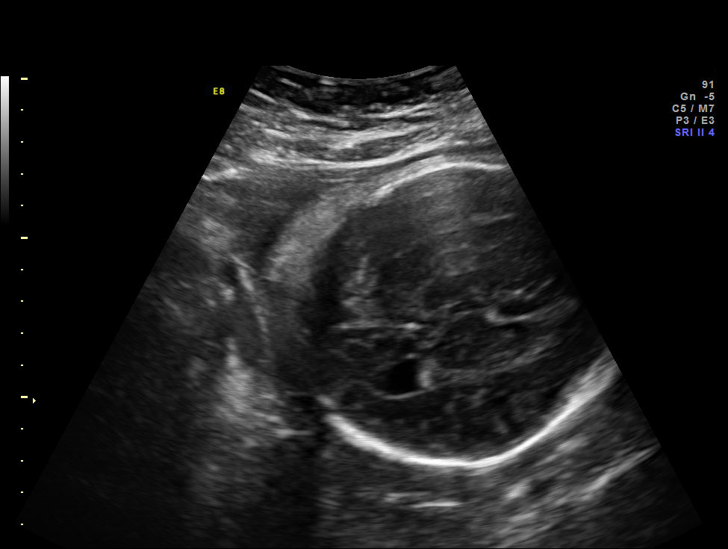

[13 of 24 positions shown; findings below may reference images not displayed]

FINDINGS: 1. Single intrauterine pregnancy.
 2.  Posterior placenta without evidence of previa.
 3. Normal amniotic fluid index.
 4. Again seen is mild right ventriculomegaly; the righ lateral
 ventricle measures 1.18cm; the left ventricle appears normal
 but is difficult to visualize.
 5. The remainder of the limited anatomy survey is normal.
 The fetal kidneys are normal.
Recommendations

 1. Mild ventriculomegaly:
 - previously counseled
 - had normal Harmony screen
 - declined prenatal consult with Pediatric Neurosurgery
 - will need postnatal follow up
 - inform Pediatricians at time of delivery
 2. Pyelectasis is resolved
 3. Previously seen ovarian cyst:
 - previously counseled
 - patient asymptomatic
 - recommend follow up postpartum

 questions or concerns.
                 Attending Physician, RICKMAN

## 2014-01-04 ENCOUNTER — Encounter (HOSPITAL_COMMUNITY): Payer: Self-pay

## 2014-01-05 ENCOUNTER — Encounter (HOSPITAL_COMMUNITY): Payer: Self-pay | Admitting: Pharmacist

## 2014-01-12 ENCOUNTER — Encounter (HOSPITAL_COMMUNITY): Payer: Self-pay | Admitting: Obstetrics and Gynecology

## 2014-01-12 DIAGNOSIS — N83209 Unspecified ovarian cyst, unspecified side: Secondary | ICD-10-CM

## 2014-01-12 HISTORY — DX: Unspecified ovarian cyst, unspecified side: N83.209

## 2014-01-16 NOTE — H&P (Signed)
Jasmine Wolfe is an 28 y.o. female  G2P1001 with persistent ovarain cyst, was 4x6, now 10x12x13.  Simple appearing.  Pt asymptomatic but some cramping and fullness.  Had SAB in 2/15, On US to r/o RPOC, cyst noted, with previous pregnancy 4x6cm simple cyst.  D/w pt management, pt opts for L/S cystectomy vs USO, inc r/b/a, wishes to proceed  Pertinent Gynecological History: Menses: regular every month without intermenstrual spotting Bleeding: WNL Contraception: none DES exposure: unknown Blood transfusions: none Sexually transmitted diseases: no past history Previous GYN Procedures: none, colpo  Last pap: normal Date: 2/14 OB History: G2, P1011 G1 VAVD 6#7, female - PIH G2 SAB H/o abn pap, colpo  US after SAB reveal enlarged ovarian cyst 10x12x13   Menstrual History: Patient's last menstrual period was 12/27/2013.    Past Medical History  Diagnosis Date  . Influenza   . Ovarian cyst     persistant 4x4x7cm one septation  . 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 Abnormal Pap smear   . Vacuum extractor delivery, delivered 11/08/2012  . Hypertension     mild  . Anxiety   . Pneumonia     07/2012  . Simple ovarian cyst 01/12/2014   But large 10x12x13  Past Surgical History  Procedure Laterality Date  . Wisdom tooth extraction    . Colposcopy      Family History  Problem Relation Age of Onset  . Other Mother     renal artery stenosis  . Anesthesia problems Mother     nausea and vomitting  . Depression Father   . Hyperlipidemia Father   . Hypertension Sister   . Cancer Maternal Grandmother     ovarian    Social History:  reports that she has never smoked. She has never used smokeless tobacco. She reports that she drinks alcohol. 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   HENT: Negative.   Eyes: Negative.   Respiratory:  Negative.   Cardiovascular: Negative.   Gastrointestinal:       Some cramping and fullness  Genitourinary: Negative.   Musculoskeletal: Negative.   Skin: Negative.   Neurological: Negative.   Psychiatric/Behavioral: Negative.     Last menstrual period 12/27/2013, unknown if currently breastfeeding. Physical Exam  Constitutional: She is oriented to person, place, and time. She appears well-developed and well-nourished.  HENT:  Head: Normocephalic and atraumatic.  Cardiovascular: Normal rate and regular rhythm.   Respiratory: Effort normal and breath sounds normal. No respiratory distress. She has no wheezes.  GI: Soft. Bowel sounds are normal. She exhibits no distension. There is no tenderness.  Musculoskeletal: Normal range of motion.  Neurological: She is alert and oriented to person, place, and time.  Skin: Skin is warm and dry.  Psychiatric: She has a normal mood and affect. Her behavior is normal.    No results found for this or any previous visit (from the past 24 hour(s)).  No results found.  Assessment/Plan: 14XW [age] G2P1011 s/p SAB with persistent ovarian cyst, large For L/S cystectomy vs USO - d/w pt r/b/a- wishes to proceed  BOVARD,Tawnie Ehresman 01/16/2014, 9:42 PM

## 2014-01-17 ENCOUNTER — Encounter (HOSPITAL_COMMUNITY): Payer: BC Managed Care – PPO | Admitting: Anesthesiology

## 2014-01-17 ENCOUNTER — Encounter (HOSPITAL_COMMUNITY): Payer: Self-pay

## 2014-01-17 ENCOUNTER — Ambulatory Visit (HOSPITAL_COMMUNITY): Payer: BC Managed Care – PPO | Admitting: Anesthesiology

## 2014-01-17 ENCOUNTER — Ambulatory Visit (HOSPITAL_COMMUNITY)
Admission: RE | Admit: 2014-01-17 | Discharge: 2014-01-17 | Disposition: A | Discharge status: Home / Self Care | Payer: BC Managed Care – PPO | Source: Ambulatory Visit | Attending: Obstetrics and Gynecology | Admitting: Obstetrics and Gynecology

## 2014-01-17 ENCOUNTER — Encounter (HOSPITAL_COMMUNITY)
Admission: RE | Disposition: A | Discharge status: Home / Self Care | Payer: Self-pay | Source: Ambulatory Visit | Attending: Obstetrics and Gynecology

## 2014-01-17 DIAGNOSIS — I10 Essential (primary) hypertension: Secondary | ICD-10-CM | POA: Insufficient documentation

## 2014-01-17 DIAGNOSIS — N83209 Unspecified ovarian cyst, unspecified side: Secondary | ICD-10-CM | POA: Insufficient documentation

## 2014-01-17 DIAGNOSIS — Z9889 Other specified postprocedural states: Secondary | ICD-10-CM

## 2014-01-17 HISTORY — DX: Other specified postprocedural states: Z98.890

## 2014-01-17 HISTORY — PX: SALPINGOOPHORECTOMY: SHX82

## 2014-01-17 HISTORY — DX: Unspecified ovarian cyst, unspecified side: N83.209

## 2014-01-17 HISTORY — DX: Anxiety disorder, unspecified: F41.9

## 2014-01-17 HISTORY — DX: Pneumonia, unspecified organism: J18.9

## 2014-01-17 HISTORY — DX: Essential (primary) hypertension: I10

## 2014-01-17 HISTORY — PX: LAPAROSCOPY: SHX197

## 2014-01-17 LAB — BASIC METABOLIC PANEL
BUN: 11 mg/dL (ref 6–23)
CO2: 26 meq/L (ref 19–32)
Calcium: 9.3 mg/dL (ref 8.4–10.5)
Chloride: 102 mEq/L (ref 96–112)
Creatinine, Ser: 0.51 mg/dL (ref 0.50–1.10)
GFR calc Af Amer: >90 mL/min (ref >90)
GFR calc non Af Amer: >90 mL/min (ref >90)
GLUCOSE: 88 mg/dL (ref 70–99)
POTASSIUM: 4.2 meq/L (ref 3.7–5.3)
Sodium: 139 mEq/L (ref 137–147)

## 2014-01-17 LAB — CBC
HCT: 39.4 % (ref 36.0–46.0)
Hemoglobin: 13 g/dL (ref 12.0–15.0)
MCH: 27.8 pg (ref 26.0–34.0)
MCHC: 33 g/dL (ref 30.0–36.0)
MCV: 84.2 fL (ref 78.0–100.0)
PLATELETS: 305 10*3/uL (ref 150–400)
RBC: 4.68 MIL/uL (ref 3.87–5.11)
RDW: 13 % (ref 11.5–15.5)
WBC: 8.5 10*3/uL (ref 4.0–10.5)

## 2014-01-17 LAB — PREGNANCY, URINE: Preg Test, Ur: NEGATIVE

## 2014-01-17 SURGERY — LAPAROSCOPY OPERATIVE
Anesthesia: General | Site: Abdomen | Laterality: Right

## 2014-01-17 MED ORDER — GLYCOPYRROLATE 0.2 MG/ML IJ SOLN
INTRAMUSCULAR | Status: DC | PRN
  Administered 2014-01-17: 0.1 mg via INTRAVENOUS
  Administered 2014-01-17: 0.6 mg via INTRAVENOUS

## 2014-01-17 MED ORDER — NEOSTIGMINE METHYLSULFATE 1 MG/ML IJ SOLN
INTRAMUSCULAR | Status: DC | PRN
  Administered 2014-01-17: 3 mg via INTRAVENOUS

## 2014-01-17 MED ORDER — LACTATED RINGERS IV SOLN: INTRAVENOUS | Status: DC

## 2014-01-17 MED ORDER — ONDANSETRON HCL 4 MG/2ML IJ SOLN: 4.0000 mg | Freq: Once | INTRAMUSCULAR | Status: DC | PRN

## 2014-01-17 MED ORDER — FENTANYL CITRATE 0.05 MG/ML IJ SOLN
INTRAMUSCULAR | Status: DC | PRN
  Administered 2014-01-17 (×2): 100 ug via INTRAVENOUS
  Administered 2014-01-17 (×2): 50 ug via INTRAVENOUS

## 2014-01-17 MED ORDER — OXYCODONE-ACETAMINOPHEN 5-325 MG PO TABS
1.0000 | ORAL_TABLET | Freq: Once | ORAL | Status: AC
  Administered 2014-01-17: 1 via ORAL

## 2014-01-17 MED ORDER — HYDROMORPHONE HCL PF 1 MG/ML IJ SOLN
INTRAMUSCULAR | Status: DC | PRN
  Administered 2014-01-17: 1 mg via INTRAVENOUS

## 2014-01-17 MED ORDER — ROCURONIUM BROMIDE 100 MG/10ML IV SOLN
INTRAVENOUS | Status: DC | PRN
  Administered 2014-01-17: 10 mg via INTRAVENOUS
  Administered 2014-01-17: 35 mg via INTRAVENOUS
  Administered 2014-01-17: 5 mg via INTRAVENOUS

## 2014-01-17 MED ORDER — DEXAMETHASONE SODIUM PHOSPHATE 10 MG/ML IJ SOLN
INTRAMUSCULAR | Status: DC | PRN
  Administered 2014-01-17: 10 mg via INTRAVENOUS

## 2014-01-17 MED ORDER — BUPIVACAINE HCL (PF) 0.25 % IJ SOLN
INTRAMUSCULAR | Status: AC
  Filled 2014-01-17: qty 30

## 2014-01-17 MED ORDER — LIDOCAINE HCL (CARDIAC) 20 MG/ML IV SOLN
INTRAVENOUS | Status: AC
  Filled 2014-01-17: qty 5

## 2014-01-17 MED ORDER — IBUPROFEN 800 MG PO TABS: 800.0000 mg | ORAL_TABLET | Freq: Three times a day (TID) | ORAL | Status: DC | PRN

## 2014-01-17 MED ORDER — MEPERIDINE HCL 25 MG/ML IJ SOLN: 6.2500 mg | INTRAMUSCULAR | Status: DC | PRN

## 2014-01-17 MED ORDER — OXYCODONE-ACETAMINOPHEN 5-325 MG PO TABS: 1.0000 | ORAL_TABLET | Freq: Four times a day (QID) | ORAL | Status: DC | PRN

## 2014-01-17 MED ORDER — NEOSTIGMINE METHYLSULFATE 1 MG/ML IJ SOLN
INTRAMUSCULAR | Status: AC
  Filled 2014-01-17: qty 1

## 2014-01-17 MED ORDER — KETOROLAC TROMETHAMINE 60 MG/2ML IM SOLN
INTRAMUSCULAR | Status: DC | PRN
  Administered 2014-01-17: 30 mg via INTRAMUSCULAR

## 2014-01-17 MED ORDER — ONDANSETRON HCL 4 MG/2ML IJ SOLN
INTRAMUSCULAR | Status: AC
  Filled 2014-01-17: qty 2

## 2014-01-17 MED ORDER — MIDAZOLAM HCL 2 MG/2ML IJ SOLN
INTRAMUSCULAR | Status: DC | PRN
  Administered 2014-01-17: 2 mg via INTRAVENOUS

## 2014-01-17 MED ORDER — KETOROLAC TROMETHAMINE 30 MG/ML IJ SOLN
INTRAMUSCULAR | Status: DC | PRN
  Administered 2014-01-17: 30 mg via INTRAVENOUS

## 2014-01-17 MED ORDER — PROPOFOL 10 MG/ML IV EMUL
INTRAVENOUS | Status: AC
  Filled 2014-01-17: qty 20

## 2014-01-17 MED ORDER — HYDROMORPHONE HCL PF 1 MG/ML IJ SOLN
INTRAMUSCULAR | Status: AC
  Filled 2014-01-17: qty 1

## 2014-01-17 MED ORDER — LIDOCAINE HCL (CARDIAC) 20 MG/ML IV SOLN
INTRAVENOUS | Status: DC | PRN
  Administered 2014-01-17: 80 mg via INTRAVENOUS

## 2014-01-17 MED ORDER — FENTANYL CITRATE 0.05 MG/ML IJ SOLN
INTRAMUSCULAR | Status: AC
  Filled 2014-01-17: qty 5

## 2014-01-17 MED ORDER — FENTANYL CITRATE 0.05 MG/ML IJ SOLN: 25.0000 ug | INTRAMUSCULAR | Status: DC | PRN

## 2014-01-17 MED ORDER — ROCURONIUM BROMIDE 100 MG/10ML IV SOLN
INTRAVENOUS | Status: AC
  Filled 2014-01-17: qty 1

## 2014-01-17 MED ORDER — MIDAZOLAM HCL 2 MG/2ML IJ SOLN
INTRAMUSCULAR | Status: AC
  Filled 2014-01-17: qty 2

## 2014-01-17 MED ORDER — KETOROLAC TROMETHAMINE 30 MG/ML IJ SOLN: 15.0000 mg | Freq: Once | INTRAMUSCULAR | Status: DC | PRN

## 2014-01-17 MED ORDER — LACTATED RINGERS IV SOLN
INTRAVENOUS | Status: DC
  Administered 2014-01-17 (×2): via INTRAVENOUS

## 2014-01-17 MED ORDER — DEXAMETHASONE SODIUM PHOSPHATE 10 MG/ML IJ SOLN
INTRAMUSCULAR | Status: AC
  Filled 2014-01-17: qty 1

## 2014-01-17 MED ORDER — FENTANYL CITRATE 0.05 MG/ML IJ SOLN
INTRAMUSCULAR | Status: AC
  Filled 2014-01-17: qty 2

## 2014-01-17 MED ORDER — ONDANSETRON HCL 4 MG/2ML IJ SOLN
INTRAMUSCULAR | Status: DC | PRN
  Administered 2014-01-17: 4 mg via INTRAVENOUS

## 2014-01-17 MED ORDER — PROPOFOL 10 MG/ML IV BOLUS
INTRAVENOUS | Status: DC | PRN
  Administered 2014-01-17: 200 mg via INTRAVENOUS
  Administered 2014-01-17: 100 mg via INTRAVENOUS

## 2014-01-17 MED ORDER — OXYCODONE-ACETAMINOPHEN 5-325 MG PO TABS
ORAL_TABLET | ORAL | Status: AC
  Filled 2014-01-17: qty 1

## 2014-01-17 SURGICAL SUPPLY — 29 items
ADH SKN CLS APL DERMABOND .7 (GAUZE/BANDAGES/DRESSINGS) ×2
BAG SPEC RTRVL LRG 6X4 10 (ENDOMECHANICALS) ×2
CATH ROBINSON RED A/P 16FR (CATHETERS) ×4 IMPLANT
CHLORAPREP W/TINT 26ML (MISCELLANEOUS) ×4 IMPLANT
CLOTH BEACON ORANGE TIMEOUT ST (SAFETY) ×4 IMPLANT
CONT SPECI 4OZ STER CLIK (MISCELLANEOUS) ×2 IMPLANT
DERMABOND ADVANCED (GAUZE/BANDAGES/DRESSINGS) ×2
DERMABOND ADVANCED .7 DNX12 (GAUZE/BANDAGES/DRESSINGS) ×2 IMPLANT
GLOVE BIO SURGEON STRL SZ 6.5 (GLOVE) ×3 IMPLANT
GLOVE BIO SURGEON STRL SZ7 (GLOVE) ×4 IMPLANT
GLOVE BIO SURGEONS STRL SZ 6.5 (GLOVE) ×1
GOWN STRL REUS W/TWL LRG LVL3 (GOWN DISPOSABLE) ×8 IMPLANT
NDL INSUFFLATION 14GA 150MM (NEEDLE) IMPLANT
NEEDLE INSUFFLATION 120MM (ENDOMECHANICALS) ×4 IMPLANT
NEEDLE INSUFFLATION 14GA 150MM (NEEDLE) ×4 IMPLANT
NS IRRIG 1000ML POUR BTL (IV SOLUTION) ×4 IMPLANT
PACK LAPAROSCOPY BASIN (CUSTOM PROCEDURE TRAY) ×4 IMPLANT
POUCH SPECIMEN RETRIEVAL 10MM (ENDOMECHANICALS) ×2 IMPLANT
PROTECTOR NERVE ULNAR (MISCELLANEOUS) ×4 IMPLANT
SET IRRIG TUBING LAPAROSCOPIC (IRRIGATION / IRRIGATOR) ×2 IMPLANT
SHEARS HARMONIC ACE PLUS 36CM (ENDOMECHANICALS) ×2 IMPLANT
SUT VICRYL 0 UR6 27IN ABS (SUTURE) ×2 IMPLANT
SUT VICRYL 4-0 PS2 18IN ABS (SUTURE) ×4 IMPLANT
SYR 50ML LL SCALE MARK (SYRINGE) ×4 IMPLANT
TOWEL OR 17X24 6PK STRL BLUE (TOWEL DISPOSABLE) ×8 IMPLANT
TROCAR XCEL NON-BLD 11X100MML (ENDOMECHANICALS) ×2 IMPLANT
TROCAR XCEL NON-BLD 5MMX100MML (ENDOMECHANICALS) ×8 IMPLANT
WARMER LAPAROSCOPE (MISCELLANEOUS) ×4 IMPLANT
WATER STERILE IRR 1000ML POUR (IV SOLUTION) ×4 IMPLANT

## 2014-01-17 NOTE — Brief Op Note (Signed)
01/17/2014  1:51 PM  PATIENT:  Jasmine Wolfe  28 y.o. female  PRE-OPERATIVE DIAGNOSIS:  Persistent Ovarian Cyst  POST-OPERATIVE DIAGNOSIS:  Persistent Ovarian Cyst  PROCEDURE:  Procedure(s): LAPAROSCOPY OPERATIVE (N/A) SALPINGO OOPHORECTOMY (Right)  SURGEON:  Surgeon(s) and Role:    * Sherron MondayJody Bovard, MD - Primary    * Lavina Hammanodd Meisinger, MD - Assisting  ANESTHESIA:   general  EBL:  Total I/O In: 1700 [I.V.:1700] Out: 610 [Urine:600; Blood:10]  BLOOD ADMINISTERED:none  DRAINS: Urinary Catheter (Foley)   LOCAL MEDICATIONS USED:  MARCAINE 0.25%    SPECIMEN:  Source of Specimen:  Right Salpingoopherectomy  DISPOSITION OF SPECIMEN:  PATHOLOGY  COUNTS:  YES  TOURNIQUET:  * No tourniquets in log *  DICTATION: .Other Dictation: Dictation Number [id number]  PLAN OF CARE: Discharge to home after PACU  PATIENT DISPOSITION:  PACU - hemodynamically stable.   Delay start of Pharmacological VTE agent (>24hrs) due to surgical blood loss or risk of bleeding: not applicable

## 2014-01-17 NOTE — Op Note (Signed)
NAMRonal Fear:  Wolfe, Jasmine Wolfe                 ACCOUNT NO.:  [id number]  MEDICAL RECORD NO.:  [id number]  LOCATION:  [facility]                          FACILITY:  [facility]  PHYSICIAN:  Sherron MondayJody Bovard, MD        DATE OF BIRTH:  1986-02-06  DATE OF PROCEDURE:  01/17/2014 DATE OF DISCHARGE:  01/17/2014                              OPERATIVE REPORT   PREOPERATIVE DIAGNOSIS:  Persistent large simple ovarian cyst by ultrasound.  POSTOPERATIVE DIAGNOSIS:  Persistent large simple ovarian cyst by ultrasound.  PROCEDURE:  Operative laparoscopy, Right laparoscopic salpingo-oophorectomy.  SURGEON:  Sherron MondayJody Bovard, MD.  ASSISTANT:  Zenaida Nieceodd D. Meisinger, M.D.  ANESTHESIA:  General.  IV FLUIDS:  1700 mL.  URINE OUTPUT:  600 mL.  EBL:  Less than 25 mL.  COMPLICATIONS:  None.  PATHOLOGY:  Right tube and ovary.  PROCEDURE IN DETAIL:  After informed consent was reviewed with the patient and her husband including, but not limited to, bleeding, infection, damage to surrounding organs, as well as trouble healing, the patient was transported to the OR, placed on the table in supine position.  General anesthesia was induced and found to be adequate.  She was then placed in the Yellofin stirrups, prepped and draped in the normal sterile fashion.  A Foley catheter was sterilely placed. Attention was turned to the abdominal portion of the case.  Then, 5 mm infraumbilical incision was made.  Using a Veress needle, pneumoperitoneum was obtained after the hanging drop test was passed. The trocar was placed on direct entry.  The cyst was noted to be large, obstructing the view of the ovary and uterus approximately 15 x 15 cm.  The decision was made to puncture her abdominal wall with a spinal needle and drain the cyst. Under direct visualization, a spinal needle and a 60 mL syringe, Alternating syringes were used to partially drain the cyst.  When partially drained, the cyst was easier to manipulate.  The R fallopian tube was  identified.  The decision was made to proceed with a right salpingo-oophorectomy.  The cyst was grasped with an atraumatic grasper using a Harmonic scalpel a hole was created in the cyst.  Using a suction irrigator the cyst was more completely drained.  The fluid appeared straw-colored.  The tube and ovary were excised using the Harmonic scalpel to the level of the cornu.  This was noted to be hemostatic.  Copious pelvic irrigation was performed.  The contralateral tube and ovary were inspected and found to be normal.  The pressure was lowered in the peritoneum, and the excision site was noted to be hemostatic.  The remaining tissue from the tube and ovary was placed in an EndoCatch bag and removed through an accessory 10 mm port.  This port was closed with a deep suture of 0 Vicryl under direct visualization.  Another deep suture was placed in the adipose tissue.  Skin was reapproximated in a subcuticular fashion with 4-0 Vicryl.  The umbilical incision and accessory port on the right were also closed with 4-0 Vicryl to close the cutaneous incision. Dermabond was applied to all incision sites.  The uterine manipulator was removed from the vagina.  The patient tolerated the procedure well. Sponge, lap, and needle counts were correct x2 per the operating staff.     Sherron Monday, MD     JB/MEDQ  D:  01/17/2014  T:  01/17/2014  Job:  [id number]

## 2014-01-17 NOTE — Anesthesia Postprocedure Evaluation (Signed)
  Anesthesia Post-op Note  Patient: Jasmine Wolfe  Procedure(s) Performed: Procedure(s): LAPAROSCOPY OPERATIVE (N/A) SALPINGO OOPHORECTOMY (Right)  Patient Location: PACU  Anesthesia Type:General  Level of Consciousness: awake, alert  and oriented  Airway and Oxygen Therapy: Patient Spontanous Breathing  Post-op Pain: mild  Post-op Assessment: Post-op Vital signs reviewed, Patient's Cardiovascular Status Stable, Respiratory Function Stable, Patent Airway, No signs of Nausea or vomiting and Pain level controlled  Post-op Vital Signs: Reviewed and stable  Complications: No apparent anesthesia complications

## 2014-01-17 NOTE — Transfer of Care (Signed)
Immediate Anesthesia Transfer of Care Note  Patient: Jasmine Wolfe  Procedure(s) Performed: Procedure(s): LAPAROSCOPY OPERATIVE (N/A) SALPINGO OOPHORECTOMY (Right)  Patient Location: PACU  Anesthesia Type:General  Level of Consciousness: awake, alert  and oriented  Airway & Oxygen Therapy: Patient Spontanous Breathing and Patient connected to nasal cannula oxygen  Post-op Assessment: Report given to PACU RN and Post -op Vital signs reviewed and stable  Post vital signs: Reviewed and stable  Complications: No apparent anesthesia complications

## 2014-01-17 NOTE — Interval H&P Note (Signed)
History and Physical Interval Note:  01/17/2014 11:54 AM  Jasmine Wolfe  has presented today for surgery, with the diagnosis of OVARIAN CYST  The various methods of treatment have been discussed with the patient and family. After consideration of risks, benefits and other options for treatment, the patient has consented to  Procedure(s): LAPAROSCOPY OPERATIVE WITH POSSIBLE OVARIAN CYSTECTOMY AND POSSIBLE RIGHT SALPINGO OOPHERECTOMY (Right) as a surgical intervention .  The patient's history has been reviewed, patient examined, no change in status, stable for surgery.  I have reviewed the patient's chart and labs.  Questions were answered to the patient's satisfaction.     BOVARD,Zakkiyya Barno

## 2014-01-17 NOTE — Anesthesia Procedure Notes (Signed)
Procedure Name: Intubation Date/Time: 01/17/2014 12:15 PM Performed by: Graciela HusbandsFUSSELL, Arris Meyn O Pre-anesthesia Checklist: Suction available, Emergency Drugs available, Timeout performed, Patient being monitored and Patient identified Patient Re-evaluated:Patient Re-evaluated prior to inductionOxygen Delivery Method: Circle system utilized Preoxygenation: Pre-oxygenation with 100% oxygen Intubation Type: IV induction Ventilation: Mask ventilation without difficulty Laryngoscope Size: Mac and 3 Grade View: Grade I Tube type: Oral Tube size: 7.0 mm Number of attempts: 1 Airway Equipment and Method: Patient positioned with wedge pillow and Stylet Placement Confirmation: ETT inserted through vocal cords under direct vision,  breath sounds checked- equal and bilateral and positive ETCO2 Secured at: 21 cm Tube secured with: Tape Dental Injury: Teeth and Oropharynx as per pre-operative assessment

## 2014-01-17 NOTE — Anesthesia Preprocedure Evaluation (Signed)
Anesthesia Evaluation  Patient identified by MRN, date of birth, ID band Patient awake    Reviewed: Allergy & Precautions, H&P , NPO status , Patient's Chart, lab work & pertinent test results  Airway Mallampati: I TM Distance: >3 FB Neck ROM: full    Dental no notable dental hx. (+) Teeth Intact   Pulmonary    Pulmonary exam normal       Cardiovascular hypertension, negative cardio ROS      Neuro/Psych negative neurological ROS     GI/Hepatic negative GI ROS, Neg liver ROS,   Endo/Other  Morbid obesity  Renal/GU negative Renal ROS     Musculoskeletal   Abdominal (+) + obese,   Peds  Hematology negative hematology ROS (+)   Anesthesia Other Findings   Reproductive/Obstetrics negative OB ROS                           Anesthesia Physical Anesthesia Plan  ASA: III  Anesthesia Plan: General   Post-op Pain Management:    Induction: Intravenous  Airway Management Planned: Oral ETT  Additional Equipment:   Intra-op Plan:   Post-operative Plan: Extubation in OR  Informed Consent: I have reviewed the patients History and Physical, chart, labs and discussed the procedure including the risks, benefits and alternatives for the proposed anesthesia with the patient or authorized representative who has indicated his/her understanding and acceptance.   Dental Advisory Given  Plan Discussed with: CRNA and Surgeon  Anesthesia Plan Comments:         Anesthesia Quick Evaluation

## 2014-01-18 ENCOUNTER — Encounter (HOSPITAL_COMMUNITY): Payer: Self-pay | Admitting: Obstetrics and Gynecology

## 2014-05-27 ENCOUNTER — Emergency Department (HOSPITAL_BASED_OUTPATIENT_CLINIC_OR_DEPARTMENT_OTHER)
Admission: EM | Admit: 2014-05-27 | Discharge: 2014-05-27 | Disposition: A | Discharge status: Home / Self Care | Payer: BC Managed Care – PPO | Attending: Emergency Medicine | Admitting: Emergency Medicine

## 2014-05-27 ENCOUNTER — Encounter (HOSPITAL_BASED_OUTPATIENT_CLINIC_OR_DEPARTMENT_OTHER): Payer: Self-pay | Admitting: Emergency Medicine

## 2014-05-27 ENCOUNTER — Emergency Department (HOSPITAL_BASED_OUTPATIENT_CLINIC_OR_DEPARTMENT_OTHER): Payer: BC Managed Care – PPO

## 2014-05-27 DIAGNOSIS — S99929A Unspecified injury of unspecified foot, initial encounter: Secondary | ICD-10-CM

## 2014-05-27 DIAGNOSIS — S99919A Unspecified injury of unspecified ankle, initial encounter: Secondary | ICD-10-CM

## 2014-05-27 DIAGNOSIS — Z9889 Other specified postprocedural states: Secondary | ICD-10-CM | POA: Insufficient documentation

## 2014-05-27 DIAGNOSIS — S92919A Unspecified fracture of unspecified toe(s), initial encounter for closed fracture: Secondary | ICD-10-CM | POA: Insufficient documentation

## 2014-05-27 DIAGNOSIS — S8990XA Unspecified injury of unspecified lower leg, initial encounter: Secondary | ICD-10-CM | POA: Insufficient documentation

## 2014-05-27 DIAGNOSIS — Z8701 Personal history of pneumonia (recurrent): Secondary | ICD-10-CM | POA: Insufficient documentation

## 2014-05-27 DIAGNOSIS — Z79899 Other long term (current) drug therapy: Secondary | ICD-10-CM | POA: Insufficient documentation

## 2014-05-27 DIAGNOSIS — I10 Essential (primary) hypertension: Secondary | ICD-10-CM | POA: Insufficient documentation

## 2014-05-27 DIAGNOSIS — Y9289 Other specified places as the place of occurrence of the external cause: Secondary | ICD-10-CM | POA: Insufficient documentation

## 2014-05-27 DIAGNOSIS — S92502A Displaced unspecified fracture of left lesser toe(s), initial encounter for closed fracture: Secondary | ICD-10-CM

## 2014-05-27 DIAGNOSIS — Z8742 Personal history of other diseases of the female genital tract: Secondary | ICD-10-CM | POA: Insufficient documentation

## 2014-05-27 DIAGNOSIS — Y9389 Activity, other specified: Secondary | ICD-10-CM | POA: Insufficient documentation

## 2014-05-27 DIAGNOSIS — F411 Generalized anxiety disorder: Secondary | ICD-10-CM | POA: Insufficient documentation

## 2014-05-27 DIAGNOSIS — IMO0002 Reserved for concepts with insufficient information to code with codable children: Secondary | ICD-10-CM | POA: Insufficient documentation

## 2014-05-27 NOTE — ED Notes (Signed)
PT discharged to home with family. NAD. 

## 2014-05-27 NOTE — ED Notes (Signed)
Pt hit her left fourth toe earlier in the day and is now having pain and swelling.

## 2014-05-27 NOTE — ED Provider Notes (Signed)
CSN: [id number]     Arrival date & time 05/27/14  1654 History   First MD Initiated Contact with Patient 05/27/14 2013    This chart was scribed for No att. providers found by Marica OtterNusrat Rahman, ED Scribe. This patient was seen in room MH02/MH02 and the patient's care was started at 8:15 PM.  Chief Complaint  Patient presents with  . Toe Injury  PCP: BABAOFF, MARC E, MD  The history is provided by the patient. No language interpreter was used.   HPI Comments: Jasmine Wolfe is a 28 y.o. female who presents to the Emergency Department complaining of injury to her left fourth toe and associated pain and swelling. Pt reports that her injuries were sustained today when a shopping cart was rammed into it. Moderate pain no radiation or associated Sxs, no weak/numb. Past Medical History  Diagnosis Date  . Influenza   . Ovarian cyst     persistant 4x4x7cm one septation  . 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 Abnormal Pap smear   . Vacuum extractor delivery, delivered 11/08/2012  . Hypertension     mild  . Anxiety   . Pneumonia     07/2012  . Simple ovarian cyst 01/12/2014  . S/P laparoscopic procedure 01/17/2014    RSO   Past Surgical History  Procedure Laterality Date  . Wisdom tooth extraction    . Colposcopy    . Laparoscopy N/A 01/17/2014    Procedure: LAPAROSCOPY OPERATIVE;  Surgeon: Sherron MondayJody Bovard, MD;  Location: [facility];  Service: Gynecology;  Laterality: N/A;  . Salpingoophorectomy Right 01/17/2014    Procedure: SALPINGO OOPHORECTOMY;  Surgeon: Sherron MondayJody Bovard, MD;  Location: [facility];  Service: Gynecology;  Laterality: Right;   Family History  Problem Relation Age of Onset  . Other Mother     renal artery stenosis  . Anesthesia problems Mother     nausea and vomitting  . Depression Father   . Hyperlipidemia Father   . Hypertension Sister   . Cancer Maternal Grandmother     ovarian   History  Substance Use Topics  . Smoking status: Never Smoker   . Smokeless tobacco: Never  Used  . Alcohol Use: Yes     Comment: rare   OB History   Grav Para Term Preterm Abortions TAB SAB Ect Mult Living   1 1 1  0 0 0 0 0 0 1     Review of Systems  See HPI  Allergies  Review of patient's allergies indicates no known allergies.  Home Medications   Prior to Admission medications   Medication Sig Start Date End Date Taking? Authorizing Provider  sertraline (ZOLOFT) 50 MG tablet Take 50 mg by mouth daily.   Yes Historical Provider, MD  ibuprofen (ADVIL,MOTRIN) 800 MG tablet Take 1 tablet (800 mg total) by mouth every 8 (eight) hours as needed for moderate pain. 01/17/14   Sherian ReinJody Bovard-Stuckert, MD  oxyCODONE-acetaminophen (ROXICET) 5-325 MG per tablet Take 1-2 tablets by mouth every 6 (six) hours as needed for severe pain. 01/17/14   Sherian ReinJody Bovard-Stuckert, MD   Triage Vitals: BP 178/107  Pulse 82  Temp(Src) 98.5 F (36.9 C) (Oral)  Resp 20  Ht 5\' 5"  (1.651 m)  Wt 232 lb (105.235 kg)  BMI 38.61 kg/m2  SpO2 97%  LMP 05/09/2014 Physical Exam  Nursing note and vitals reviewed. Constitutional:  Awake, alert, nontoxic appearance.  HENT:  Head: Atraumatic.  Eyes: Right eye exhibits no discharge. Left eye exhibits no discharge.  Neck: Neck supple.  Pulmonary/Chest: Effort normal. She exhibits no tenderness.  Abdominal: Soft. There is no tenderness. There is no rebound.  Musculoskeletal: She exhibits tenderness.  Baseline ROM, no obvious new focal weakness. Pt denies left hip tenderness, thigh, lower leg, knee or ankle. DP pulse intact, CR less than 2 seconds, nl light touch, able to dorsi flex and plantar flex toes. No rotational defect. No significant angulation.   Neurological:  Mental status and motor strength appears baseline for patient and situation.  Skin: No rash noted.  Psychiatric: She has a normal mood and affect.    ED Course  Procedures (including critical care time) DIAGNOSTIC STUDIES: Oxygen Saturation is 97% on RA, nl by my interpretation.     COORDINATION OF CARE: 8:18 PM-Discussed treatment plan which includes imaging results, buddy taping injured toes, and meds with pt at bedside and pt agreed to plan.   Labs Review Labs Reviewed - No data to display  Imaging Review Dg Foot Complete Left  05/27/2014   CLINICAL DATA:  Left foot pain centered at the fourth toe  EXAM: LEFT FOOT - COMPLETE 3+ VIEW  COMPARISON:  None.  FINDINGS: There is an oblique fracture through the proximal phalanx of the left fourth toe. No apparent intra-articular extension. No radiopaque foreign body. No dislocation.  IMPRESSION: Oblique fracture, proximal phalanx left fourth toe.   Electronically Signed   By: Christiana Pellant M.D.   On: 05/27/2014 17:48     EKG Interpretation None      MDM   Final diagnoses:  Fracture of fourth toe, left, closed, initial encounter  I doubt any other EMC precluding discharge at this time including, but not necessarily limited to the following:midfoot Fx.  I personally performed the services described in this documentation, which was scribed in my presence. The recorded information has been reviewed and is accurate.    Hurman Horn, MD 05/29/14 [phone]

## 2014-05-27 NOTE — Discharge Instructions (Signed)
Buddy Taping of Toes °We have taped your toes together to keep them from moving. This is called "buddy taping" since we used a part of your own body to keep the injured part still. We placed soft padding between your toes to keep them from rubbing against each other. Buddy taping will help with healing and to reduce pain. Keep your toes buddy taped together for as long as directed by your caregiver. °HOME CARE INSTRUCTIONS  °· Raise your injured area above the level of your heart while sitting or lying down. Prop it up with pillows. °· An ice pack used every twenty minutes, while awake, for the first one to two days may be helpful. Put ice in a plastic bag and put a towel between the bag and your skin. °· Watch for signs that the taping is too tight. These signs may be: °¨ Numbness of your taped toes. °¨ Coolness of your taped toes. °¨ Color change in the area beyond the tape. °¨ Increased pain. °· If you have any of these signs, loosen or rewrap the tape. If you need to loosen or rewrap the buddy tape, make sure you use the padding again. °SEEK IMMEDIATE MEDICAL CARE IF:  °· You have worse pain, swelling, inflammation (soreness), drainage or bleeding after you rewrap the tape. °· Any new problems occur. °MAKE SURE YOU:  °· Understand these instructions. °· Will watch your condition. °· Will get help right away if you are not doing well or get worse. °Document Released: 07/17/2004 Document Revised: 01/05/2012 Document Reviewed: 10/10/2008 °[employer]® Patient Information ©2015 [employer]. This information is not intended to replace advice given to you by your health care provider. Make sure you discuss any questions you have with your health care provider. ° °

## 2014-08-11 ENCOUNTER — Encounter (HOSPITAL_COMMUNITY): Payer: Self-pay

## 2014-08-15 NOTE — H&P (Signed)
Jasmine Wolfe is an 28 y.o. female G3P1011 at 9+ weeks with missed Ab by 9 wk confirmatory scan.  Pt with h/o dermoid, s/p RSO 3/15.  With SAB in 1/15.  D/W pt 1/6 pregnancies end as SAB.  Pt voices understanding, also d/w pt options: expectant mgmt, cytotec induction or D&E; including r/b/a of each.  Pt opts for D&C.    Pertinent Gynecological History: G3P1011 G1 VAVD 6#7, female G2 1/15 SAB G3 present, missed AB by US  + abn pap, colpo, nl since.  Last WNL, HR HPV neg No STDs  Menstrual History:  Patient's last menstrual period was 06/05/2014.    Past Medical History  Diagnosis Date  . Influenza   . Ovarian cyst     persistant 4x4x7cm one septation  . 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 Abnormal Pap smear   . Vacuum extractor delivery, delivered 11/08/2012  . Anxiety   . Pneumonia     07/2012  . Simple ovarian cyst 01/12/2014  . S/P laparoscopic procedure 01/17/2014    RSO  . Hypertension     mild, during pregnancy  dermoid - RSO  Past Surgical History  Procedure Laterality Date  . Wisdom tooth extraction    . Colposcopy    . Laparoscopy N/A 01/17/2014    Procedure: LAPAROSCOPY OPERATIVE;  Surgeon: Sherron MondayJody Bovard, MD;  Location: [facility];  Service: Gynecology;  Laterality: N/A;  . Salpingoophorectomy Right 01/17/2014    Procedure: SALPINGO OOPHORECTOMY;  Surgeon: Sherron MondayJody Bovard, MD;  Location: [facility];  Service: Gynecology;  Laterality: Right;    Family History  Problem Relation Age of Onset  . Other Mother     renal artery stenosis  . Anesthesia problems Mother     nausea and vomitting  . Depression Father   . Hyperlipidemia Father   . Hypertension Sister   . Cancer Maternal Grandmother     ovarian    Social History:  reports that she has never smoked. She has never used smokeless tobacco. She reports that she drinks alcohol. She reports that she does not use illicit drugs.married, [facility] [place] [facility]  Allergies: No Known Allergies  Meds: PNV,  Zoloft 100mg    Review of Systems  Constitutional: Negative.   HENT: Negative.   Eyes: Negative.   Respiratory: Negative.   Cardiovascular: Negative.   Gastrointestinal: Negative.   Genitourinary: Negative.   Musculoskeletal: Negative.   Skin: Negative.   Neurological: Negative.   Psychiatric/Behavioral: Positive for depression.    Height 5' 5.5" (1.664 m), weight 108.863 kg (240 lb), last menstrual period 06/05/2014, unknown if currently breastfeeding. Physical Exam  Constitutional: She is oriented to person, place, and time. She appears well-developed and well-nourished.  HENT:  Head: Normocephalic and atraumatic.  Cardiovascular: Normal rate and regular rhythm.   Respiratory: Effort normal and breath sounds normal. No respiratory distress. She has no wheezes.  GI: Soft. Bowel sounds are normal. She exhibits no distension. There is no tenderness.  Musculoskeletal: Normal range of motion.  Neurological: She is alert and oriented to person, place, and time.  Skin: Skin is warm and dry.  Psychiatric: She has a normal mood and affect. Her behavior is normal.    US - missed AB measuring 7wk, no FHTs Blood type = O+  Assessment/Plan: 28yo G3P1011 at 7wk missed Ab for D&C D/w pt r/b/a wish to proceed US in short stay to confirm missed Ab  Bovard-Stuckert, [name] 08/15/2014, 9:04 PM

## 2014-08-16 ENCOUNTER — Ambulatory Visit (HOSPITAL_COMMUNITY): Payer: BC Managed Care – PPO

## 2014-08-16 ENCOUNTER — Encounter (HOSPITAL_COMMUNITY)
Admission: RE | Disposition: A | Discharge status: Home / Self Care | Payer: Self-pay | Source: Ambulatory Visit | Attending: Obstetrics and Gynecology

## 2014-08-16 ENCOUNTER — Encounter (HOSPITAL_COMMUNITY): Payer: Self-pay | Admitting: Anesthesiology

## 2014-08-16 ENCOUNTER — Encounter (HOSPITAL_COMMUNITY): Payer: BC Managed Care – PPO | Admitting: Anesthesiology

## 2014-08-16 ENCOUNTER — Ambulatory Visit (HOSPITAL_COMMUNITY)
Admission: RE | Admit: 2014-08-16 | Discharge: 2014-08-16 | Disposition: A | Discharge status: Home / Self Care | Payer: BC Managed Care – PPO | Source: Ambulatory Visit | Attending: Obstetrics and Gynecology | Admitting: Obstetrics and Gynecology

## 2014-08-16 ENCOUNTER — Ambulatory Visit (HOSPITAL_COMMUNITY): Payer: BC Managed Care – PPO | Admitting: Anesthesiology

## 2014-08-16 DIAGNOSIS — O021 Missed abortion: Secondary | ICD-10-CM | POA: Diagnosis present

## 2014-08-16 DIAGNOSIS — Z6839 Body mass index (BMI) 39.0-39.9, adult: Secondary | ICD-10-CM | POA: Diagnosis not present

## 2014-08-16 DIAGNOSIS — Z9889 Other specified postprocedural states: Secondary | ICD-10-CM

## 2014-08-16 DIAGNOSIS — O034 Incomplete spontaneous abortion without complication: Secondary | ICD-10-CM

## 2014-08-16 HISTORY — DX: Other specified postprocedural states: Z98.890

## 2014-08-16 HISTORY — DX: Incomplete spontaneous abortion without complication: O03.4

## 2014-08-16 HISTORY — PX: DILATION AND CURETTAGE OF UTERUS: SHX78

## 2014-08-16 LAB — CBC
HCT: 40.4 % (ref 36.0–46.0)
Hemoglobin: 13.3 g/dL (ref 12.0–15.0)
MCH: 27.5 pg (ref 26.0–34.0)
MCHC: 32.9 g/dL (ref 30.0–36.0)
MCV: 83.6 fL (ref 78.0–100.0)
PLATELETS: 321 10*3/uL (ref 150–400)
RBC: 4.83 MIL/uL (ref 3.87–5.11)
RDW: 13.3 % (ref 11.5–15.5)
WBC: 9.8 10*3/uL (ref 4.0–10.5)

## 2014-08-16 SURGERY — DILATION AND CURETTAGE
Anesthesia: Monitor Anesthesia Care | Site: Uterus

## 2014-08-16 MED ORDER — OXYCODONE-ACETAMINOPHEN 5-325 MG PO TABS: 1.0000 | ORAL_TABLET | Freq: Four times a day (QID) | ORAL | Status: DC | PRN

## 2014-08-16 MED ORDER — METOCLOPRAMIDE HCL 5 MG/ML IJ SOLN: 10.0000 mg | Freq: Once | INTRAMUSCULAR | Status: DC | PRN

## 2014-08-16 MED ORDER — FENTANYL CITRATE 0.05 MG/ML IJ SOLN: 25.0000 ug | INTRAMUSCULAR | Status: DC | PRN

## 2014-08-16 MED ORDER — DEXAMETHASONE SODIUM PHOSPHATE 4 MG/ML IJ SOLN
INTRAMUSCULAR | Status: AC
  Filled 2014-08-16: qty 1

## 2014-08-16 MED ORDER — CEFAZOLIN SODIUM-DEXTROSE 2-3 GM-% IV SOLR
2.0000 g | INTRAVENOUS | Status: AC
  Administered 2014-08-16: 2 g via INTRAVENOUS

## 2014-08-16 MED ORDER — MIDAZOLAM HCL 2 MG/2ML IJ SOLN
INTRAMUSCULAR | Status: DC | PRN
  Administered 2014-08-16: 2 mg via INTRAVENOUS

## 2014-08-16 MED ORDER — IBUPROFEN 800 MG PO TABS: 800.0000 mg | ORAL_TABLET | Freq: Three times a day (TID) | ORAL | Status: DC | PRN

## 2014-08-16 MED ORDER — PROPOFOL 10 MG/ML IV EMUL
INTRAVENOUS | Status: AC
  Filled 2014-08-16: qty 20

## 2014-08-16 MED ORDER — CEFAZOLIN SODIUM-DEXTROSE 2-3 GM-% IV SOLR
INTRAVENOUS | Status: AC
  Filled 2014-08-16: qty 50

## 2014-08-16 MED ORDER — LIDOCAINE HCL 2 % IJ SOLN
INTRAMUSCULAR | Status: DC | PRN
  Administered 2014-08-16: 20 mL

## 2014-08-16 MED ORDER — SCOPOLAMINE 1 MG/3DAYS TD PT72
1.0000 | MEDICATED_PATCH | Freq: Once | TRANSDERMAL | Status: DC
  Administered 2014-08-16: 1.5 mg via TRANSDERMAL

## 2014-08-16 MED ORDER — LACTATED RINGERS IV SOLN
INTRAVENOUS | Status: DC
  Administered 2014-08-16: 08:00:00 via INTRAVENOUS

## 2014-08-16 MED ORDER — FENTANYL CITRATE 0.05 MG/ML IJ SOLN
INTRAMUSCULAR | Status: DC | PRN
  Administered 2014-08-16 (×2): 50 ug via INTRAVENOUS

## 2014-08-16 MED ORDER — ONDANSETRON HCL 4 MG/2ML IJ SOLN
INTRAMUSCULAR | Status: DC | PRN
  Administered 2014-08-16: 4 mg via INTRAVENOUS

## 2014-08-16 MED ORDER — LIDOCAINE HCL (CARDIAC) 20 MG/ML IV SOLN
INTRAVENOUS | Status: DC | PRN
  Administered 2014-08-16: 30 mg via INTRAVENOUS
  Administered 2014-08-16: 70 mg via INTRAVENOUS

## 2014-08-16 MED ORDER — KETOROLAC TROMETHAMINE 30 MG/ML IJ SOLN
INTRAMUSCULAR | Status: AC
  Filled 2014-08-16: qty 1

## 2014-08-16 MED ORDER — LIDOCAINE HCL 2 % IJ SOLN
INTRAMUSCULAR | Status: AC
  Filled 2014-08-16: qty 20

## 2014-08-16 MED ORDER — ONDANSETRON HCL 4 MG/2ML IJ SOLN
INTRAMUSCULAR | Status: AC
  Filled 2014-08-16: qty 2

## 2014-08-16 MED ORDER — PROPOFOL 10 MG/ML IV EMUL
INTRAVENOUS | Status: DC | PRN
  Administered 2014-08-16: 10 mg via INTRAVENOUS
  Administered 2014-08-16 (×4): 20 mg via INTRAVENOUS
  Administered 2014-08-16: 30 mg via INTRAVENOUS
  Administered 2014-08-16: 10 mg via INTRAVENOUS
  Administered 2014-08-16: 20 mg via INTRAVENOUS

## 2014-08-16 MED ORDER — LIDOCAINE HCL (CARDIAC) 20 MG/ML IV SOLN
INTRAVENOUS | Status: AC
  Filled 2014-08-16: qty 5

## 2014-08-16 MED ORDER — MIDAZOLAM HCL 2 MG/2ML IJ SOLN
INTRAMUSCULAR | Status: AC
  Filled 2014-08-16: qty 2

## 2014-08-16 MED ORDER — SCOPOLAMINE 1 MG/3DAYS TD PT72
MEDICATED_PATCH | TRANSDERMAL | Status: AC
  Administered 2014-08-16: 1.5 mg via TRANSDERMAL
  Filled 2014-08-16: qty 1

## 2014-08-16 MED ORDER — KETOROLAC TROMETHAMINE 30 MG/ML IJ SOLN
INTRAMUSCULAR | Status: DC | PRN
  Administered 2014-08-16: 30 mg via INTRAVENOUS

## 2014-08-16 MED ORDER — MEPERIDINE HCL 25 MG/ML IJ SOLN: 6.2500 mg | INTRAMUSCULAR | Status: DC | PRN

## 2014-08-16 MED ORDER — FENTANYL CITRATE 0.05 MG/ML IJ SOLN
INTRAMUSCULAR | Status: AC
  Filled 2014-08-16: qty 2

## 2014-08-16 SURGICAL SUPPLY — 17 items
CATH ROBINSON RED A/P 16FR (CATHETERS) ×4 IMPLANT
CLOTH BEACON ORANGE TIMEOUT ST (SAFETY) ×4 IMPLANT
CONTAINER PREFILL 10% NBF 60ML (FORM) IMPLANT
GLOVE BIO SURGEON STRL SZ 6.5 (GLOVE) ×3 IMPLANT
GLOVE BIO SURGEON STRL SZ7 (GLOVE) ×4 IMPLANT
GLOVE BIO SURGEONS STRL SZ 6.5 (GLOVE) ×1
GLOVE BIOGEL PI IND STRL 7.0 (GLOVE) ×1 IMPLANT
GLOVE BIOGEL PI IND STRL 7.5 (GLOVE) ×1 IMPLANT
GLOVE BIOGEL PI INDICATOR 7.0 (GLOVE) ×2
GLOVE BIOGEL PI INDICATOR 7.5 (GLOVE) ×2
GLOVE ECLIPSE 7.5 STRL STRAW (GLOVE) ×3 IMPLANT
GOWN STRL REUS W/TWL LRG LVL3 (GOWN DISPOSABLE) ×5 IMPLANT
GOWN SURGICAL XLG (GOWNS) ×3 IMPLANT
PACK VAGINAL MINOR WOMEN LF (CUSTOM PROCEDURE TRAY) ×4 IMPLANT
PAD OB MATERNITY 4.3X12.25 (PERSONAL CARE ITEMS) ×4 IMPLANT
PAD PREP 24X48 CUFFED NSTRL (MISCELLANEOUS) ×4 IMPLANT
TOWEL OR 17X24 6PK STRL BLUE (TOWEL DISPOSABLE) ×8 IMPLANT

## 2014-08-16 NOTE — Discharge Instructions (Signed)
°  NO Ibuprofen containing products (ie Advil, Aleve, Motrin, etc.) until after 3:00 pm today.  DISCHARGE INSTRUCTIONS: D&C / D&E The following instructions have been prepared to help you care for yourself upon your return home.   Personal hygiene:  Use sanitary pads for vaginal drainage, not tampons.  Shower the day after your procedure.  NO tub baths, pools or Jacuzzis for 2-3 weeks.  Wipe front to back after using the bathroom.  Activity and limitations:  Do NOT drive or operate any equipment for 24 hours. The effects of anesthesia are still present and drowsiness may result.  Do NOT rest in bed all day.  Walking is encouraged.  Walk up and down stairs slowly.  You may resume your normal activity in one to two days or as indicated by your physician.  Sexual activity: NO intercourse for at least 2 weeks after the procedure, or as indicated by your physician.  Diet: Eat a light meal as desired this evening. You may resume your usual diet tomorrow.  Return to work: You may resume your work activities in one to two days or as indicated by your doctor.  What to expect after your surgery: Expect to have vaginal bleeding/discharge for 2-3 days and spotting for up to 10 days. It is not unusual to have soreness for up to 1-2 weeks. You may have a slight burning sensation when you urinate for the first day. Mild cramps may continue for a couple of days. You may have a regular period in 2-6 weeks.  Call your doctor for any of the following:  Excessive vaginal bleeding, saturating and changing one pad every hour.  Inability to urinate 6 hours after discharge from hospital.  Pain not relieved by pain medication.  Fever of 100.4 F or greater.  Unusual vaginal discharge or odor.   Call for an appointment:    Patients signature: ______________________  Nurses signature ________________________  Support person's signature_______________________

## 2014-08-16 NOTE — Interval H&P Note (Signed)
History and Physical Interval Note:  08/16/2014 8:28 AM  Jasmine Wolfe  has presented today for surgery, with the diagnosis of Missed Abortion  The various methods of treatment have been discussed with the patient and family. After consideration of risks, benefits and other options for treatment, the patient has consented to  Procedure(s): SUCTION DILATATION AND CURETTAGE (N/A) OPERATIVE ULTRASOUND (N/A) as a surgical intervention .  The patient's history has been reviewed, patient examined, no change in status, stable for surgery.  I have reviewed the patient's chart and labs.  Questions were answered to the patient's satisfaction.     Bovard-Stuckert, Xitlally Mooneyham

## 2014-08-16 NOTE — Anesthesia Postprocedure Evaluation (Signed)
  Anesthesia Post-op Note  Patient: Jasmine Wolfe  Procedure(s) Performed: Procedure(s): SUCTION DILATATION AND CURETTAGE (N/A)  Patient Location: PACU  Anesthesia Type:MAC  Level of Consciousness: awake, alert  and oriented  Airway and Oxygen Therapy: Patient Spontanous Breathing  Post-op Pain: none  Post-op Assessment: Post-op Vital signs reviewed, Patient's Cardiovascular Status Stable, Respiratory Function Stable, Patent Airway, No signs of Nausea or vomiting and Pain level controlled  Post-op Vital Signs: Reviewed and stable  Last Vitals:  Filed Vitals:   08/16/14 0915  BP: 132/65  Pulse: 74  Temp:   Resp: 16    Complications: No apparent anesthesia complications

## 2014-08-16 NOTE — Anesthesia Preprocedure Evaluation (Signed)
Anesthesia Evaluation  Patient identified by MRN, date of birth, ID band Patient awake    Reviewed: Allergy & Precautions, H&P , NPO status , Patient's Chart, lab work & pertinent test results  Airway Mallampati: III TM Distance: >3 FB Neck ROM: Full    Dental no notable dental hx. (+) Teeth Intact   Pulmonary pneumonia -, resolved,  breath sounds clear to auscultation  Pulmonary exam normal       Cardiovascular hypertension, Rhythm:Regular Rate:Normal  Hx/o PIH   Neuro/Psych negative neurological ROS  negative psych ROS   GI/Hepatic negative GI ROS, Neg liver ROS,   Endo/Other  Morbid obesity  Renal/GU negative Renal ROS  negative genitourinary   Musculoskeletal negative musculoskeletal ROS (+)   Abdominal (+) + obese,   Peds  Hematology  (+) anemia ,   Anesthesia Other Findings   Reproductive/Obstetrics (+) Pregnancy Missed Ab                           Anesthesia Physical Anesthesia Plan  ASA: III  Anesthesia Plan: MAC   Post-op Pain Management:    Induction: Intravenous  Airway Management Planned: Natural Airway and Simple Face Mask  Additional Equipment:   Intra-op Plan:   Post-operative Plan:   Informed Consent: I have reviewed the patients History and Physical, chart, labs and discussed the procedure including the risks, benefits and alternatives for the proposed anesthesia with the patient or authorized representative who has indicated his/her understanding and acceptance.     Plan Discussed with: Anesthesiologist, CRNA and Surgeon  Anesthesia Plan Comments:         Anesthesia Quick Evaluation

## 2014-08-16 NOTE — Transfer of Care (Signed)
Immediate Anesthesia Transfer of Care Note  Patient: Jasmine Wolfe  Procedure(s) Performed: Procedure(s): SUCTION DILATATION AND CURETTAGE (N/A)  Patient Location: PACU  Anesthesia Type:MAC  Level of Consciousness: awake, alert , oriented and patient cooperative  Airway & Oxygen Therapy: Patient Spontanous Breathing  Post-op Assessment: Report given to PACU RN and Post -op Vital signs reviewed and stable  Post vital signs: Reviewed and stable  Complications: No apparent anesthesia complications

## 2014-08-16 NOTE — Progress Notes (Signed)
I received a page from Donnelly StagerAmy, Spinski, RN in Recovery.  Jasmine Wolfe was tearful and reflected on the long journey they have had with a miscarriage in January as well as this.  Tom, her husband reflected on finding out at their first appointment that there was no heartbeat.  They are coping as well as can be expected.  I offered initial support and let them know about resources for follow-up support.  [address]Chaplain Katy [place] Pager, [phone] 12:34 PM   08/16/14 1200  Clinical Encounter Type  Visited With Patient and family together  Visit Type Spiritual support  Referral From Nurse  Spiritual Encounters  Spiritual Needs Emotional

## 2014-08-16 NOTE — Brief Op Note (Signed)
08/16/2014  9:04 AM  PATIENT:  Jasmine GuileMary E Wolfe  28 y.o. female  PRE-OPERATIVE DIAGNOSIS:  Missed Abortion  POST-OPERATIVE DIAGNOSIS:  Missed Abortion  PROCEDURE:  Procedure(s): SUCTION DILATATION AND CURETTAGE (N/A)  SURGEON:  Surgeon(s) and Role:    * Jadon Harbaugh Bovard-Stuckert, MD - Primary  ANESTHESIA:   IV sedation and paracervical block  EBL:  Total I/O In: 900 [I.V.:900] Out: 110 [Urine:50; Blood:60]  BLOOD ADMINISTERED:none  DRAINS: none   LOCAL MEDICATIONS USED:  LIDOCAINE 2%  and Amount: 20ml  SPECIMEN:  Source of Specimen:  POC  DISPOSITION OF SPECIMEN:  PATHOLOGY  COUNTS:  YES  TOURNIQUET:  * No tourniquets in log *  DICTATION: .Other Dictation: Dictation Number [id number]  PLAN OF CARE: Discharge to home after PACU  PATIENT DISPOSITION:  PACU - hemodynamically stable.   Delay start of Pharmacological VTE agent (>24hrs) due to surgical blood loss or risk of bleeding: not applicable

## 2014-08-16 NOTE — Op Note (Signed)
NAMRonal Fear:  Borton, Tarin                 ACCOUNT NO.:  [id number]  MEDICAL RECORD NO.:  [id number]  LOCATION:  [facility]                          [facility]ACILITY:  [facility]  PHYSICIAN:  Sherron MondayJody Bovard, MD        DATE OF BIRTH:  1986-03-23  DATE OF PROCEDURE:  08/16/2014 DATE OF DISCHARGE:                              OPERATIVE REPORT   PREOPERATIVE DIAGNOSIS:  Missed abortion.  POSTOPERATIVE DIAGNOSIS:  Missed abortion.  PROCEDURE:  Suction D and E.  SURGEON:  Renesmae Donahey Bovard, MD.  ANESTHESIA:  IV sedation with 20 mL 2% lidocaine paracervical block.  ESTIMATED BLOOD LOSS:  650 mL.  URINE OUTPUT:  50 mL by I and O cath prior to procedure.  IV FLUIDS:  900 mL.  COMPLICATIONS:  None.  PATHOLOGY:  Products of conception to Pathology.  PROCEDURE IN DETAIL:  After verification of the missed abortion, the patient was transported in the office.  The patient had been counseled regarding her options of expectant management, Cytotec induction, and D and C.  She opted for D and C.  She was transported to the OR, placed on the table in supine position.  MAC anesthesia was induced, found to be adequate.  She was then placed in the elephant stirrups.  Prepped and draped in the normal sterile fashion.  Her bladder was sterilely drained.  Using an open-sided speculum, her cervix was easily visualized, and 20 mL of 2% lidocaine was instilled with paracervical block at approximately 12, 5, 7 o'clock.  Her cervix was dilated to accommodate a 7-French curette with Pratt dilators to 21.  There were several passes of the uterine cavity revealing products of conception __________ were essentially negative.  There was pressure held on a cervical bleeder from the tenaculum that had been placed at 12 o'clock. Instruments were removed from the vagina.  The patient was awakened in stable condition.  She tolerated the procedure well.  Sponge, lap, and needle counts were correct x2.     Sherron MondayJody Bovard, MD     JB/MEDQ  D:   08/16/2014  T:  08/16/2014  Job:  [id number]

## 2014-08-17 ENCOUNTER — Encounter (HOSPITAL_COMMUNITY): Payer: Self-pay | Admitting: Obstetrics and Gynecology

## 2014-08-28 ENCOUNTER — Encounter (HOSPITAL_COMMUNITY): Payer: Self-pay | Admitting: Obstetrics and Gynecology

## 2014-10-27 NOTE — L&D Delivery Note (Signed)
Delivery Note At 4:34 PM a viable and healthy female was delivered via Vaginal, Spontaneous Delivery (Presentation: ; Occiput Anterior).  APGAR: 9,9 ; weight P  .   Placenta status: Intact, Spontaneous.  Cord:  with the following complications: None.  Baby to NICU after skin to skin  Anesthesia: Epidural  Episiotomy: None Lacerations: 2nd degree;Perineal Suture Repair: 3.0 vicryl rapide Est. Blood Loss (mL): 125cc  Mom to postpartum.  Baby to NICU.  Bovard-Stuckert, Doctor Sheahan 05/19/2015, 5:08 PM  Br/POP/O+/RI/Tdap in [facility]  Desires circumcision for female infant after discussion of r/b/a

## 2014-11-09 ENCOUNTER — Encounter (HOSPITAL_COMMUNITY): Payer: Self-pay | Admitting: Obstetrics and Gynecology

## 2014-11-20 LAB — OB RESULTS CONSOLE ABO/RH: RH Type: POSITIVE

## 2014-11-20 LAB — OB RESULTS CONSOLE GC/CHLAMYDIA
CHLAMYDIA, DNA PROBE: NEGATIVE
GC PROBE AMP, GENITAL: NEGATIVE

## 2014-11-20 LAB — OB RESULTS CONSOLE HIV ANTIBODY (ROUTINE TESTING): HIV: NONREACTIVE

## 2014-11-20 LAB — OB RESULTS CONSOLE RUBELLA ANTIBODY, IGM: Rubella: IMMUNE

## 2014-11-20 LAB — OB RESULTS CONSOLE HEPATITIS B SURFACE ANTIGEN: HEP B S AG: NEGATIVE

## 2014-11-20 LAB — OB RESULTS CONSOLE ANTIBODY SCREEN: ANTIBODY SCREEN: NEGATIVE

## 2014-11-20 LAB — OB RESULTS CONSOLE RPR: RPR: NONREACTIVE

## 2015-04-05 ENCOUNTER — Encounter (HOSPITAL_COMMUNITY): Payer: Self-pay | Admitting: Obstetrics and Gynecology

## 2015-05-19 ENCOUNTER — Inpatient Hospital Stay (HOSPITAL_COMMUNITY): Payer: BLUE CROSS/BLUE SHIELD | Admitting: Anesthesiology

## 2015-05-19 ENCOUNTER — Inpatient Hospital Stay (HOSPITAL_COMMUNITY): Payer: BLUE CROSS/BLUE SHIELD

## 2015-05-19 ENCOUNTER — Inpatient Hospital Stay (HOSPITAL_COMMUNITY)
Admission: AD | Admit: 2015-05-19 | Discharge: 2015-05-21 | DRG: 775 | Disposition: A | Discharge status: Home / Self Care | Payer: BLUE CROSS/BLUE SHIELD | Source: Ambulatory Visit | Attending: Obstetrics and Gynecology | Admitting: Obstetrics and Gynecology

## 2015-05-19 ENCOUNTER — Encounter (HOSPITAL_COMMUNITY): Payer: Self-pay | Admitting: *Deleted

## 2015-05-19 DIAGNOSIS — Z3689 Encounter for other specified antenatal screening: Secondary | ICD-10-CM | POA: Insufficient documentation

## 2015-05-19 DIAGNOSIS — Z3A34 34 weeks gestation of pregnancy: Secondary | ICD-10-CM | POA: Diagnosis present

## 2015-05-19 DIAGNOSIS — F329 Major depressive disorder, single episode, unspecified: Secondary | ICD-10-CM | POA: Diagnosis present

## 2015-05-19 DIAGNOSIS — F419 Anxiety disorder, unspecified: Secondary | ICD-10-CM | POA: Diagnosis present

## 2015-05-19 DIAGNOSIS — O99343 Other mental disorders complicating pregnancy, third trimester: Secondary | ICD-10-CM | POA: Diagnosis present

## 2015-05-19 LAB — COMPREHENSIVE METABOLIC PANEL
ALK PHOS: 124 U/L (ref 38–126)
ALT: 27 U/L (ref 14–54)
AST: 25 U/L (ref 15–41)
Albumin: 2.6 g/dL — ABNORMAL LOW (ref 3.5–5.0)
Anion gap: 6 (ref 5–15)
BUN: 10 mg/dL (ref 6–20)
CO2: 21 mmol/L — AB (ref 22–32)
CREATININE: 0.48 mg/dL (ref 0.44–1.00)
Calcium: 8.5 mg/dL — ABNORMAL LOW (ref 8.9–10.3)
Chloride: 105 mmol/L (ref 101–111)
GFR calc Af Amer: >60 mL/min (ref >60)
Glucose, Bld: 104 mg/dL — ABNORMAL HIGH (ref 65–99)
POTASSIUM: 4.3 mmol/L (ref 3.5–5.1)
Sodium: 132 mmol/L — ABNORMAL LOW (ref 135–145)
Total Bilirubin: 0.3 mg/dL (ref 0.3–1.2)
Total Protein: 6.4 g/dL — ABNORMAL LOW (ref 6.5–8.1)

## 2015-05-19 LAB — CBC
HCT: 32.9 % — ABNORMAL LOW (ref 36.0–46.0)
HCT: 32.7 % — ABNORMAL LOW (ref 36.0–46.0)
Hemoglobin: 10.9 g/dL — ABNORMAL LOW (ref 12.0–15.0)
Hemoglobin: 10.7 g/dL — ABNORMAL LOW (ref 12.0–15.0)
MCH: 27.2 pg (ref 26.0–34.0)
MCH: 26.8 pg (ref 26.0–34.0)
MCHC: 33.1 g/dL (ref 30.0–36.0)
MCHC: 32.7 g/dL (ref 30.0–36.0)
MCV: 82 fL (ref 78.0–100.0)
MCV: 81.8 fL (ref 78.0–100.0)
Platelets: 317 10*3/uL (ref 150–400)
Platelets: 323 10*3/uL (ref 150–400)
RBC: 4.01 MIL/uL (ref 3.87–5.11)
RBC: 4 MIL/uL (ref 3.87–5.11)
RDW: 14 % (ref 11.5–15.5)
RDW: 14 % (ref 11.5–15.5)
WBC: 14.1 10*3/uL — ABNORMAL HIGH (ref 4.0–10.5)
WBC: 22.6 10*3/uL — ABNORMAL HIGH (ref 4.0–10.5)

## 2015-05-19 LAB — TYPE AND SCREEN
ABO/RH(D): O POS
Antibody Screen: NEGATIVE

## 2015-05-19 LAB — PROTEIN / CREATININE RATIO, URINE
CREATININE, URINE: 39 mg/dL
Protein Creatinine Ratio: 0.18 mg/mg{Cre} — ABNORMAL HIGH (ref 0.00–0.15)
TOTAL PROTEIN, URINE: 7 mg/dL

## 2015-05-19 LAB — RPR: RPR Ser Ql: NONREACTIVE

## 2015-05-19 LAB — HIV ANTIBODY (ROUTINE TESTING W REFLEX): HIV Screen 4th Generation wRfx: NONREACTIVE

## 2015-05-19 LAB — URIC ACID: URIC ACID, SERUM: 4.7 mg/dL (ref 2.3–6.6)

## 2015-05-19 LAB — POCT FERN TEST: POCT FERN TEST: POSITIVE

## 2015-05-19 LAB — LACTATE DEHYDROGENASE: LDH: 143 U/L (ref 98–192)

## 2015-05-19 MED ORDER — SIMETHICONE 80 MG PO CHEW
80.0000 mg | CHEWABLE_TABLET | ORAL | Status: DC | PRN
Start: 2015-05-19 — End: 2015-05-21

## 2015-05-19 MED ORDER — BETAMETHASONE SOD PHOS & ACET 6 (3-3) MG/ML IJ SUSP
12.0000 mg | Freq: Once | INTRAMUSCULAR | Status: AC
  Administered 2015-05-19: 12 mg via INTRAMUSCULAR
  Filled 2015-05-19: qty 2

## 2015-05-19 MED ORDER — LACTATED RINGERS IV SOLN: 500.0000 mL | INTRAVENOUS | Status: DC | PRN

## 2015-05-19 MED ORDER — TERBUTALINE SULFATE 1 MG/ML IJ SOLN: 0.2500 mg | Freq: Once | INTRAMUSCULAR | Status: DC | PRN

## 2015-05-19 MED ORDER — ONDANSETRON HCL 4 MG/2ML IJ SOLN: 4.0000 mg | Freq: Four times a day (QID) | INTRAMUSCULAR | Status: DC | PRN

## 2015-05-19 MED ORDER — FAMOTIDINE 20 MG PO TABS
20.0000 mg | ORAL_TABLET | Freq: Every day | ORAL | Status: DC
  Filled 2015-05-19: qty 1

## 2015-05-19 MED ORDER — BUPIVACAINE HCL (PF) 0.25 % IJ SOLN
INTRAMUSCULAR | Status: DC | PRN
  Administered 2015-05-19 (×2): 4 mL

## 2015-05-19 MED ORDER — WITCH HAZEL-GLYCERIN EX PADS: 1.0000 "application " | MEDICATED_PAD | CUTANEOUS | Status: DC | PRN

## 2015-05-19 MED ORDER — LACTATED RINGERS IV BOLUS (SEPSIS)
300.0000 mL | Freq: Once | INTRAVENOUS | Status: AC
  Administered 2015-05-19: 300 mL via INTRAVENOUS

## 2015-05-19 MED ORDER — FENTANYL 2.5 MCG/ML BUPIVACAINE 1/10 % EPIDURAL INFUSION (WH - ANES)
14.0000 mL/h | INTRAMUSCULAR | Status: DC | PRN
  Administered 2015-05-19: 14 mL/h via EPIDURAL
  Administered 2015-05-19: 12 mL/h via EPIDURAL
  Filled 2015-05-19 (×2): qty 125

## 2015-05-19 MED ORDER — ACETAMINOPHEN 325 MG PO TABS: 650.0000 mg | ORAL_TABLET | ORAL | Status: DC | PRN

## 2015-05-19 MED ORDER — LIDOCAINE HCL (PF) 1 % IJ SOLN
30.0000 mL | INTRAMUSCULAR | Status: DC | PRN
  Filled 2015-05-19: qty 30

## 2015-05-19 MED ORDER — BETAMETHASONE SOD PHOS & ACET 6 (3-3) MG/ML IJ SUSP
12.0000 mg | Freq: Once | INTRAMUSCULAR | Status: DC
  Filled 2015-05-19: qty 2

## 2015-05-19 MED ORDER — ZOLPIDEM TARTRATE 5 MG PO TABS: 5.0000 mg | ORAL_TABLET | Freq: Every evening | ORAL | Status: DC | PRN

## 2015-05-19 MED ORDER — EPHEDRINE 5 MG/ML INJ
10.0000 mg | INTRAVENOUS | Status: DC | PRN
  Filled 2015-05-19: qty 2

## 2015-05-19 MED ORDER — PHENYLEPHRINE 40 MCG/ML (10ML) SYRINGE FOR IV PUSH (FOR BLOOD PRESSURE SUPPORT)
80.0000 ug | PREFILLED_SYRINGE | INTRAVENOUS | Status: DC | PRN
  Filled 2015-05-19: qty 2
  Filled 2015-05-19: qty 20

## 2015-05-19 MED ORDER — LACTATED RINGERS IV SOLN: INTRAVENOUS | Status: DC

## 2015-05-19 MED ORDER — OXYCODONE-ACETAMINOPHEN 5-325 MG PO TABS
1.0000 | ORAL_TABLET | ORAL | Status: DC | PRN
  Administered 2015-05-20 (×2): 1 via ORAL
  Filled 2015-05-19 (×3): qty 1

## 2015-05-19 MED ORDER — FLEET ENEMA 7-19 GM/118ML RE ENEM: 1.0000 | ENEMA | RECTAL | Status: DC | PRN

## 2015-05-19 MED ORDER — DIPHENHYDRAMINE HCL 25 MG PO CAPS: 25.0000 mg | ORAL_CAPSULE | Freq: Four times a day (QID) | ORAL | Status: DC | PRN

## 2015-05-19 MED ORDER — ONDANSETRON HCL 4 MG/2ML IJ SOLN: 4.0000 mg | INTRAMUSCULAR | Status: DC | PRN

## 2015-05-19 MED ORDER — OXYCODONE-ACETAMINOPHEN 5-325 MG PO TABS: 2.0000 | ORAL_TABLET | ORAL | Status: DC | PRN

## 2015-05-19 MED ORDER — PRENATAL MULTIVITAMIN CH
1.0000 | ORAL_TABLET | Freq: Every day | ORAL | Status: DC
  Administered 2015-05-20: 1 via ORAL
  Filled 2015-05-19: qty 1

## 2015-05-19 MED ORDER — OXYTOCIN 40 UNITS IN LACTATED RINGERS INFUSION - SIMPLE MED: 62.5000 mL/h | INTRAVENOUS | Status: DC

## 2015-05-19 MED ORDER — LIDOCAINE-EPINEPHRINE (PF) 2 %-1:200000 IJ SOLN
INTRAMUSCULAR | Status: DC | PRN
  Administered 2015-05-19: 4 mL

## 2015-05-19 MED ORDER — LACTATED RINGERS IV SOLN
INTRAVENOUS | Status: DC
  Administered 2015-05-19: 125 mL/h via INTRAVENOUS
  Administered 2015-05-19 (×2): via INTRAVENOUS

## 2015-05-19 MED ORDER — LORATADINE 10 MG PO TABS
10.0000 mg | ORAL_TABLET | Freq: Every day | ORAL | Status: DC
  Filled 2015-05-19 (×3): qty 1

## 2015-05-19 MED ORDER — SENNOSIDES-DOCUSATE SODIUM 8.6-50 MG PO TABS
2.0000 | ORAL_TABLET | ORAL | Status: DC
  Administered 2015-05-20: 2 via ORAL
  Filled 2015-05-19 (×2): qty 2

## 2015-05-19 MED ORDER — BUTORPHANOL TARTRATE 1 MG/ML IJ SOLN: 1.0000 mg | INTRAMUSCULAR | Status: DC | PRN

## 2015-05-19 MED ORDER — LANOLIN HYDROUS EX OINT: TOPICAL_OINTMENT | CUTANEOUS | Status: DC | PRN

## 2015-05-19 MED ORDER — DIPHENHYDRAMINE HCL 50 MG/ML IJ SOLN: 12.5000 mg | INTRAMUSCULAR | Status: DC | PRN

## 2015-05-19 MED ORDER — OXYTOCIN 40 UNITS IN LACTATED RINGERS INFUSION - SIMPLE MED
1.0000 m[IU]/min | INTRAVENOUS | Status: DC
  Administered 2015-05-19 (×2): 2 m[IU]/min via INTRAVENOUS
  Filled 2015-05-19 (×2): qty 1000

## 2015-05-19 MED ORDER — CITRIC ACID-SODIUM CITRATE 334-500 MG/5ML PO SOLN: 30.0000 mL | ORAL | Status: DC | PRN

## 2015-05-19 MED ORDER — ONDANSETRON HCL 4 MG PO TABS: 4.0000 mg | ORAL_TABLET | ORAL | Status: DC | PRN

## 2015-05-19 MED ORDER — PENICILLIN G POTASSIUM 5000000 UNITS IJ SOLR
2.5000 10*6.[IU] | INTRAVENOUS | Status: DC
  Administered 2015-05-19 (×3): 2.5 10*6.[IU] via INTRAVENOUS
  Filled 2015-05-19 (×6): qty 2.5

## 2015-05-19 MED ORDER — OXYTOCIN BOLUS FROM INFUSION
500.0000 mL | INTRAVENOUS | Status: DC
  Administered 2015-05-19: 500 mL via INTRAVENOUS

## 2015-05-19 MED ORDER — DEXTROSE 5 % IV SOLN
5.0000 10*6.[IU] | Freq: Once | INTRAVENOUS | Status: AC
  Administered 2015-05-19: 5 10*6.[IU] via INTRAVENOUS
  Filled 2015-05-19: qty 5

## 2015-05-19 MED ORDER — BENZOCAINE-MENTHOL 20-0.5 % EX AERO: 1.0000 "application " | INHALATION_SPRAY | CUTANEOUS | Status: DC | PRN

## 2015-05-19 MED ORDER — DIBUCAINE 1 % RE OINT: 1.0000 "application " | TOPICAL_OINTMENT | RECTAL | Status: DC | PRN

## 2015-05-19 MED ORDER — IBUPROFEN 600 MG PO TABS
600.0000 mg | ORAL_TABLET | Freq: Four times a day (QID) | ORAL | Status: DC
  Administered 2015-05-20 – 2015-05-21 (×6): 600 mg via ORAL
  Filled 2015-05-19 (×6): qty 1

## 2015-05-19 MED ORDER — OXYCODONE-ACETAMINOPHEN 5-325 MG PO TABS: 1.0000 | ORAL_TABLET | ORAL | Status: DC | PRN

## 2015-05-19 NOTE — MAU Note (Signed)
Large gush fld 2345 that woke me up and i continue to leak clear fld. Mild cramping

## 2015-05-19 NOTE — H&P (Signed)
Jasmine Wolfe is a 29 y.o. female [phone] at 34+6 with ROM, clear fluid at about 1am.  Pt with h/o recurrent pregnancy loss.  Also h/o depression and anxiety after SAB doing well now has Veterinary surgeon.  +FM, no VB, occ ctx.  Clear fluid at LOF,  Maternal Medical History:  Reason for admission: Rupture of membranes.   Contractions: Frequency: irregular.    Fetal activity: Perceived fetal activity is normal.    Prenatal complications: no prenatal complications Prenatal Complications - Diabetes: none.    OB History    Gravida Para Term Preterm AB TAB SAB Ectopic Multiple Living   0 2 0 2 0 0 1    G1 40wk SVD 6#7, female G2 and G3 SAB G4 present  Recurrent SAB H/o abn pap, colpo, nl since S/p gardasil  Past Medical History  Diagnosis Date  . Influenza   . Ovarian cyst     persistant 4x4x7cm one septation  . 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 Abnormal Pap smear   . Vacuum extractor delivery, delivered 11/08/2012  . Anxiety   . Pneumonia     07/2012  . Simple ovarian cyst 01/12/2014  . S/P laparoscopic procedure 01/17/2014    RSO  . Hypertension     mild, during pregnancy  . Incomplete spontaneous abortion 08/16/2014  . S/P dilatation and curettage 08/16/2014   Past Surgical History  Procedure Laterality Date  . Wisdom tooth extraction    . Colposcopy    . Laparoscopy N/A 01/17/2014    Procedure: LAPAROSCOPY OPERATIVE;  Surgeon: Sherron Monday, MD;  Location: [facility];  Service: Gynecology;  Laterality: N/A;  . Salpingoophorectomy Right 01/17/2014    Procedure: SALPINGO OOPHORECTOMY;  Surgeon: Sherron Monday, MD;  Location: [facility];  Service: Gynecology;  Laterality: Right;  . Dilation and curettage of uterus N/A 08/16/2014    Procedure: SUCTION DILATATION AND CURETTAGE;  Surgeon: Sherian Rein, MD;  Location: [facility];  Service: Gynecology;  Laterality: N/A;   Family History: family history includes Anesthesia problems in her mother; Cancer in her maternal  grandmother; Depression in her father; Hyperlipidemia in her father; Hypertension in her sister; Other in her mother. ovarian ca, renal artery stenosis, fibroids, breast ca, thyroid ca Social History:  reports that she has never smoked. She has never used smokeless tobacco. 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   HENT: Negative.   Eyes: Negative.   Respiratory: Negative.   Cardiovascular: Negative.   Gastrointestinal: Negative.   Genitourinary: Negative.   Musculoskeletal: Negative.   Skin: Negative.   Neurological: Negative.   Psychiatric/Behavioral: Negative.     Dilation: 2 Effacement (%): 50 Station: 0 Exam by:: Dr. Ellyn Hack Blood pressure 151/76, pulse 111, temperature 98.2 F (36.8 C), temperature source Oral, resp. rate 20, height 5' 5.5" (1.664 m), weight 128.912 kg (284 lb 3.2 oz), SpO2 99 %, unknown if currently breastfeeding. Maternal Exam:  Abdomen: Patient reports no abdominal tenderness. Fundal height is appropriate for gestation.   Estimated fetal weight is 5#.   Fetal presentation: vertex  Introitus: Normal vulva. Normal vagina.  Pelvis: adequate for delivery.  Cervix: Cervix evaluated by digital exam.     Physical Exam  Constitutional: She is oriented to person, place, and time. She appears well-developed and well-nourished.  HENT:  Head: Normocephalic and atraumatic.  Cardiovascular: Normal rate and regular rhythm.   Respiratory: Effort normal and breath sounds normal. No respiratory distress. She has no wheezes.  GI: Soft. Bowel sounds are  normal. She exhibits no distension. There is no tenderness.  Musculoskeletal: Normal range of motion.  Neurological: She is alert and oriented to person, place, and time.  Skin: Skin is warm and dry.  Psychiatric: She has a normal mood and affect. Her behavior is normal.    Prenatal labs: ABO, Rh: --/--/O POS (07/23 0210) Antibody: NEG (07/23 0210) Rubella: Immune (01/25 0000) RPR: Nonreactive (01/25 0000)  HBsAg: Negative (01/25 0000)  HIV: Non-reactive (01/25 0000)  GBS:   unknown  Hgb 13.1/Plt 413K/ GC neg/Chl neg/First Tri and AFP WNL/glucola 100  [place] - nl anat, post plac, previa resolved Tdap 03/30/15  Assessment/Plan: 29yo [id number] at 34+6 with ROM for IOL Betamethasone for late preterm IOL with pitocin Epidural prn Expect SVD NICU aware - d/w pt chance of NICU admission   Bovard-Stuckert, Justis Dupas 05/19/2015, 9:29 AM

## 2015-05-19 NOTE — Progress Notes (Signed)
IFSE removed with tip intact 

## 2015-05-19 NOTE — Progress Notes (Signed)
Bovard, MD, notified of SVE and unable to determine presentation at this time. Notified that a bedside ultrasound at 0257 confirmed vertex. Orders received to start pitocin at 2 milliunits and not to increase until MD comes in to perform additional bedside ultrasound.

## 2015-05-19 NOTE — Anesthesia Preprocedure Evaluation (Signed)
Anesthesia Evaluation  Patient identified by MRN, date of birth, ID band Patient awake    Reviewed: Allergy & Precautions, Patient's Chart, lab work & pertinent test results  History of Anesthesia Complications Negative for: history of anesthetic complications  Airway Mallampati: III  TM Distance: >3 FB Neck ROM: Full    Dental  (+) Teeth Intact   Pulmonary neg pulmonary ROS,    breath sounds clear to auscultation       Cardiovascular hypertension,  Rhythm:Regular     Neuro/Psych negative neurological ROS  negative psych ROS   GI/Hepatic negative GI ROS, Neg liver ROS,   Endo/Other  negative endocrine ROS  Renal/GU negative Renal ROS     Musculoskeletal   Abdominal   Peds  Hematology negative hematology ROS (+)   Anesthesia Other Findings   Reproductive/Obstetrics (+) Pregnancy                             Anesthesia Physical Anesthesia Plan  ASA: II  Anesthesia Plan: Epidural   Post-op Pain Management:    Induction:   Airway Management Planned:   Additional Equipment:   Intra-op Plan:   Post-operative Plan:   Informed Consent: I have reviewed the patients History and Physical, chart, labs and discussed the procedure including the risks, benefits and alternatives for the proposed anesthesia with the patient or authorized representative who has indicated his/her understanding and acceptance.   Dental advisory given  Plan Discussed with: Anesthesiologist  Anesthesia Plan Comments:         Anesthesia Quick Evaluation  

## 2015-05-19 NOTE — MAU Provider Note (Signed)
History     CSN: [id number]  Arrival date and time: 05/19/15 0036   None     Chief Complaint  Patient presents with  . Rupture of Membranes   HPI Jasmine Wolfe 29 y.o. G2P1001 @ [redacted]w[redacted]d    Large gush fld 2345 that woke me up and i continue to leak clear fld. Mild cramping  Past Medical History  Diagnosis Date  . Influenza   . Ovarian cyst     persistant 4x4x7cm one septation  . 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 Abnormal Pap smear   . Vacuum extractor delivery, delivered 11/08/2012  . Anxiety   . Pneumonia     07/2012  . Simple ovarian cyst 01/12/2014  . S/P laparoscopic procedure 01/17/2014    RSO  . Hypertension     mild, during pregnancy  . Incomplete spontaneous abortion 08/16/2014  . S/P dilatation and curettage 08/16/2014    Past Surgical History  Procedure Laterality Date  . Wisdom tooth extraction    . Colposcopy    . Laparoscopy N/A 01/17/2014    Procedure: LAPAROSCOPY OPERATIVE;  Surgeon: Sherron Monday, MD;  Location: [facility];  Service: Gynecology;  Laterality: N/A;  . Salpingoophorectomy Right 01/17/2014    Procedure: SALPINGO OOPHORECTOMY;  Surgeon: Sherron Monday, MD;  Location: [facility];  Service: Gynecology;  Laterality: Right;  . Dilation and curettage of uterus N/A 08/16/2014    Procedure: SUCTION DILATATION AND CURETTAGE;  Surgeon: Sherian Rein, MD;  Location: [facility];  Service: Gynecology;  Laterality: N/A;    Family History  Problem Relation Age of Onset  . Other Mother     renal artery stenosis  . Anesthesia problems Mother     nausea and vomitting  . Depression Father   . Hyperlipidemia Father   . Hypertension Sister   . Cancer Maternal Grandmother     ovarian    History  Substance Use Topics  . Smoking status: Never Smoker   . Smokeless tobacco: Never Used  . Alcohol Use: Yes     Comment: rare    Allergies: No Known Allergies  Prescriptions prior to admission  Medication Sig Dispense Refill Last Dose  .  acetaminophen (TYLENOL) 500 MG tablet Take 1,000 mg by mouth daily as needed for mild pain or moderate pain.   Past Week at Unknown time  . Prenatal Vit-Fe Fumarate-FA (PRENATAL MULTIVITAMIN) TABS tablet Take 1 tablet by mouth daily at 12 noon.   Past Week at Unknown time  . ranitidine (ZANTAC) 75 MG tablet Take 75 mg by mouth 2 (two) times daily.   05/18/2015 at Unknown time  . ibuprofen (ADVIL,MOTRIN) 800 MG tablet Take 1 tablet (800 mg total) by mouth every 8 (eight) hours as needed for moderate pain. 45 tablet 1   . oxyCODONE-acetaminophen (ROXICET) 5-325 MG per tablet Take 1-2 tablets by mouth every 6 (six) hours as needed for severe pain. 20 tablet 0   . Probiotic Product (PROBIOTIC DAILY PO) Take by mouth.   Past Month at Unknown time  . sertraline (ZOLOFT) 100 MG tablet Take 100 mg by mouth daily.   08/16/2014 at Unknown time    Review of Systems  Constitutional: Negative for fever.  Gastrointestinal: Positive for abdominal pain.  All other systems reviewed and are negative.  Physical Exam   Blood pressure 140/79, pulse 113, temperature 98.3 F (36.8 C), resp. rate 20, height 5' 5.5" (1.664 m), weight 128.912 kg (284 lb 3.2 oz), SpO2 98 %, unknown if currently  breastfeeding.  Physical Exam  Nursing note and vitals reviewed. Constitutional: She is oriented to person, place, and time. She appears well-developed and well-nourished. No distress.  HENT:  Head: Normocephalic.  Neck: Normal range of motion.  Cardiovascular: Normal rate.   Respiratory: Effort normal. No respiratory distress.  GI: Soft. There is no tenderness.  Genitourinary: Vagina normal.  Musculoskeletal: Normal range of motion.  Neurological: She is alert and oriented to person, place, and time.  Skin: Skin is warm and dry.  Psychiatric: She has a normal mood and affect. Her behavior is normal. Judgment and thought content normal.    MAU Course  Procedures  MDM Positive Fern; Dr Neale Burly made aware. Pt will be  admitted to labor and delivery for augmentation. FHT Cat 1 ; occasional contractions  Assessment and Plan  Preterm Labor  Admit to [facility] [place] 05/19/2015, 1:55 AM Large gush fld 2345 that woke me up and i continue to leak clear fld. Mild cramping

## 2015-05-19 NOTE — Anesthesia Procedure Notes (Signed)
Epidural Patient location during procedure: OB  Staffing Anesthesiologist: Jenicka Coxe, CHRIS Performed by: anesthesiologist   Preanesthetic Checklist Completed: patient identified, surgical consent, pre-op evaluation, timeout performed, IV checked, risks and benefits discussed and monitors and equipment checked  Epidural Patient position: sitting Prep: DuraPrep Patient monitoring: heart rate, cardiac monitor, continuous pulse ox and blood pressure Approach: midline Location: L4-L5 Injection technique: LOR saline  Needle:  Needle type: Tuohy  Needle gauge: 17 G Needle length: 9 cm Needle insertion depth: 9 cm Catheter type: closed end flexible Catheter size: 19 Gauge Catheter at skin depth: 15 cm Test dose: negative and 2% lidocaine with Epi 1:200 K  Assessment Events: blood not aspirated, injection not painful, no injection resistance, negative IV test and no paresthesia  Additional Notes Reason for block:procedure for pain

## 2015-05-20 LAB — CBC
HEMATOCRIT: 33.4 % — AB (ref 36.0–46.0)
Hemoglobin: 10.8 g/dL — ABNORMAL LOW (ref 12.0–15.0)
MCH: 26.7 pg (ref 26.0–34.0)
MCHC: 32.3 g/dL (ref 30.0–36.0)
MCV: 82.5 fL (ref 78.0–100.0)
Platelets: 348 10*3/uL (ref 150–400)
RBC: 4.05 MIL/uL (ref 3.87–5.11)
RDW: 14.1 % (ref 11.5–15.5)
WBC: 19.7 10*3/uL — ABNORMAL HIGH (ref 4.0–10.5)

## 2015-05-20 NOTE — Progress Notes (Signed)
Post Partum Day 1 Subjective: no complaints, up ad lib, voiding, tolerating PO and nl lochia, pain controlled  Objective: Blood pressure 131/88, pulse 99, temperature 98 F (36.7 C), temperature source Oral, resp. rate 18, height 5' 5.5" (1.664 m), weight 128.912 kg (284 lb 3.2 oz), last menstrual period 06/05/2014, SpO2 100 %, unknown if currently breastfeeding.  Physical Exam:  General: alert and no distress Lochia: appropriate Uterine Fundus: firm   Recent Labs  05/19/15 1747 05/20/15 0541  HGB 10.7* 10.8*  HCT 32.7* 33.4*    Assessment/Plan: Plan for discharge tomorrow, Breastfeeding and Lactation consult.  Baby in NICU due to gestational age, doing well.     LOS: 1 day   Wolfe, Jasmine Koble 05/20/2015, 8:22 AM

## 2015-05-20 NOTE — Plan of Care (Signed)
Problem: Phase I Progression Outcomes Goal: Pain controlled with appropriate interventions Outcome: Completed/Met Date Met:  05/20/15 Good pain control on po Motrin and Percocet per patient. Goal: Voiding adequately Outcome: Completed/Met Date Met:  05/20/15 Voids qs urine post I&O cath. Goal: OOB as tolerated unless otherwise ordered Outcome: Completed/Met Date Met:  05/20/15 Walked down to NICU and tolerated well. Goal: VS, stable, temp < 100.4 degrees F Outcome: Completed/Met Date Met:  05/20/15 VSS at this time. Goal: Initial discharge plan identified Outcome: Completed/Met Date Met:  05/20/15 VSS Pain controlled Understands self care and F/U care Knows when to call MD Goal: Other Phase I Outcomes/Goals Outcome: Completed/Met Date Met:  05/20/15 Right leg no longer numb,able to support weight.

## 2015-05-20 NOTE — Plan of Care (Signed)
Problem: Discharge Progression Outcomes Goal: Barriers To Progression Addressed/Resolved Outcome: Completed/Met Date Met:  05/20/15 Numbness in Right leg resolved. Goal: Activity appropriate for discharge plan Outcome: Completed/Met Date Met:  05/20/15 Tolerated walking to NICU well. Goal: Complications resolved/controlled Outcome: Completed/Met Date Met:  05/20/15 Right leg  numb on admission,but can ambulate well now. Goal: Pain controlled with appropriate interventions Outcome: Completed/Met Date Met:  05/20/15 Good pain control on po Motrin and Percocet. Goal: Afebrile, VS remain stable at discharge. Outcome: Completed/Met Date Met:  05/20/15 VSS stable at this time. Goal: Discharge plan in place and appropriate Outcome: Completed/Met Date Met:  05/20/15 VSS Pain controlled.

## 2015-05-20 NOTE — Anesthesia Postprocedure Evaluation (Signed)
  Anesthesia Post-op Note  Patient: Jasmine Wolfe  Procedure(s) Performed: * No procedures listed *  Patient Location: [facility] Unit  Anesthesia Type:Epidural  Level of Consciousness: awake, alert , oriented and patient cooperative  Airway and Oxygen Therapy: Patient Spontanous Breathing  Post-op Pain: none  Post-op Assessment: Post-op Vital signs reviewed, Patient's Cardiovascular Status Stable, Respiratory Function Stable, Patent Airway, No headache, No backache and Patient able to bend at knees              Post-op Vital Signs: Reviewed and stable  Last Vitals:  Filed Vitals:   05/20/15 0543  BP: 131/88  Pulse: 99  Temp: 36.7 C  Resp: 18    Complications: No apparent anesthesia complications

## 2015-05-21 MED ORDER — PRENATAL MULTIVITAMIN CH: 1.0000 | ORAL_TABLET | Freq: Every day | ORAL | Status: AC

## 2015-05-21 MED ORDER — OXYCODONE-ACETAMINOPHEN 5-325 MG PO TABS: 1.0000 | ORAL_TABLET | Freq: Four times a day (QID) | ORAL | Status: AC | PRN

## 2015-05-21 MED ORDER — IBUPROFEN 800 MG PO TABS: 800.0000 mg | ORAL_TABLET | Freq: Three times a day (TID) | ORAL | Status: AC | PRN

## 2015-05-21 NOTE — Progress Notes (Signed)
Discharge teaching complete. Pt understood all instructions and did not have any questions. Pt ambulated out of the hospital and discharged home to family.  

## 2015-05-21 NOTE — Progress Notes (Signed)
Post Partum Day 2 Subjective: no complaints, up ad lib, voiding, tolerating PO and nl lochia, pain controlled  Objective: Blood pressure 111/68, pulse 68, temperature 98 F (36.7 C), temperature source Oral, resp. rate 18, height 5' 5.5" (1.664 m), weight 128.912 kg (284 lb 3.2 oz), last menstrual period 06/05/2014, SpO2 100 %, unknown if currently breastfeeding.  Physical Exam:  General: alert and no distress Lochia: appropriate Uterine Fundus: firm   Recent Labs  05/19/15 1747 05/20/15 0541  HGB 10.7* 10.8*  HCT 32.7* 33.4*    Assessment/Plan: Discharge home, Breastfeeding and Lactation consult.  Baby d/c'd from NICU doing well.     LOS: 2 days   Bovard-Stuckert, Marikay Roads 05/21/2015, 8:03 AM

## 2015-05-21 NOTE — Lactation Note (Signed)
This note was copied from the chart of Jasmine Wolfe. Lactation Consultation Note  Patient Name: Jasmine Guadalupe Kerekes [id number] Date: 05/21/2015 Reason for consult: Initial assessment;Late preterm infant LPI, 24 hours old. Mom reports that she nursed first child for 6 months exclusively, but feels like she barely had enough supply. Mom concerned about feeding plan that will enc baby to nurse exclusively. Enc mom to continue to nurse baby for 10-15 minutes with cues and at least every 3 hours, based on how well baby doing at breast. Then enc mom to supplement with EBM/formula, and post-pump with her personal DEBP for 15 minutes and save EBM for next feeding. Enc mom to offer lots of STS and to limit total feeding time to 30 minutes. Discussed LPI behavior and feeding concerns, and mom given LPI teaching sheet with review. Discussed nursing/pumping and returning to work as well. Mom aware of OP/BFSG and LC phone line assistance after D/C. Discussed how to gradually reduce supplementation and make sure baby gaining weight as baby reaching original due date. Offered to assist mom with latching baby, but mom declined, stating that baby latching well but just getting tired at breast. Offered to set mom up with a supplemental nursing system, but mom declined stating that she likes pumping while FOB gives supplemental bottle. Enc mom to call for assistance as needed.   Maternal Data Has patient been taught Hand Expression?: Yes Does the patient have breastfeeding experience prior to this delivery?: Yes  Feeding Feeding Type: Breast Fed Nipple Type: Slow - flow Length of feed: 10 min  LATCH Score/Interventions                      Lactation Tools Discussed/Used Pump Review: Setup, frequency, and cleaning;Milk Storage Initiated by:: Bedside RN. Date initiated:: 05/19/15   Consult Status Consult Status: PRN    Geralynn Ochs 05/21/2015, 10:08 AM

## 2015-05-21 NOTE — Discharge Summary (Signed)
Obstetric Discharge Summary Reason for Admission: rupture of membranes Prenatal Procedures: none Intrapartum Procedures: spontaneous vaginal delivery Postpartum Procedures: none Complications-Operative and Postpartum: 2nd degree perineal laceration HEMOGLOBIN  Date Value Ref Range Status  05/20/2015 10.8* 12.0 - 15.0 g/dL Final   HCT  Date Value Ref Range Status  05/20/2015 33.4* 36.0 - 46.0 % Final    Physical Exam:  General: alert and no distress Lochia: appropriate Uterine Fundus: firm  Discharge Diagnoses: Term Pregnancy-delivered  Discharge Information: Date: 05/21/2015 Activity: pelvic rest Diet: routine Medications: PNV, Ibuprofen and Percocet Condition: stable Instructions: refer to practice specific booklet Discharge to: home Follow-up Information    Follow up with Bovard-Stuckert, Demyah Smyre, MD. Schedule an appointment as soon as possible for a visit in 6 weeks.   Specialty:  Obstetrics and Gynecology   Why:  postpartum visit   Contact information:   [address] [place] [address] [phone]       Newborn Data: Live born female  Birth Weight: 5 lb 5.4 oz (2420 g) APGAR: 9, 9  Home with mother.  Bovard-Stuckert, Betheny Suchecki 05/21/2015, 8:15 AM
